# Patient Record
Sex: Male | Born: 1965 | Race: Black or African American | Hispanic: No | Smoking: Never smoker
Health system: Southern US, Community
[De-identification: ages and names within clinical notes are randomized; demographics above are authoritative.]

## PROBLEM LIST (undated history)

## (undated) DIAGNOSIS — I10 Essential (primary) hypertension: Secondary | ICD-10-CM

## (undated) DIAGNOSIS — E119 Type 2 diabetes mellitus without complications: Secondary | ICD-10-CM

## (undated) DIAGNOSIS — E785 Hyperlipidemia, unspecified: Secondary | ICD-10-CM

## (undated) HISTORY — DX: Hyperlipidemia, unspecified: E78.5

## (undated) HISTORY — DX: Essential (primary) hypertension: I10

## (undated) HISTORY — DX: Type 2 diabetes mellitus without complications: E11.9

---

## 2004-10-11 ENCOUNTER — Emergency Department (HOSPITAL_COMMUNITY): Admission: EM | Admit: 2004-10-11 | Discharge: 2004-10-11 | Payer: Self-pay | Admitting: Family Medicine

## 2005-12-30 ENCOUNTER — Emergency Department (HOSPITAL_COMMUNITY): Admission: EM | Admit: 2005-12-30 | Discharge: 2005-12-30 | Payer: Self-pay | Admitting: Family Medicine

## 2006-05-14 ENCOUNTER — Emergency Department (HOSPITAL_COMMUNITY): Admission: EM | Admit: 2006-05-14 | Discharge: 2006-05-15 | Payer: Self-pay | Admitting: Emergency Medicine

## 2006-07-02 ENCOUNTER — Emergency Department (HOSPITAL_COMMUNITY): Admission: EM | Admit: 2006-07-02 | Discharge: 2006-07-02 | Payer: Self-pay | Admitting: Family Medicine

## 2008-03-21 ENCOUNTER — Emergency Department (HOSPITAL_COMMUNITY): Admission: EM | Admit: 2008-03-21 | Discharge: 2008-03-21 | Payer: Self-pay | Admitting: Family Medicine

## 2008-09-30 IMAGING — CR DG SHOULDER 2+V*R*
3 series · 3 of 3 positions shown · non-contrast
Comparison: None.

CLINICAL DATA: Right shoulder pain following a fall.

RIGHT SHOULDER - 2+ VIEW

[view not recorded (1 of 3)]
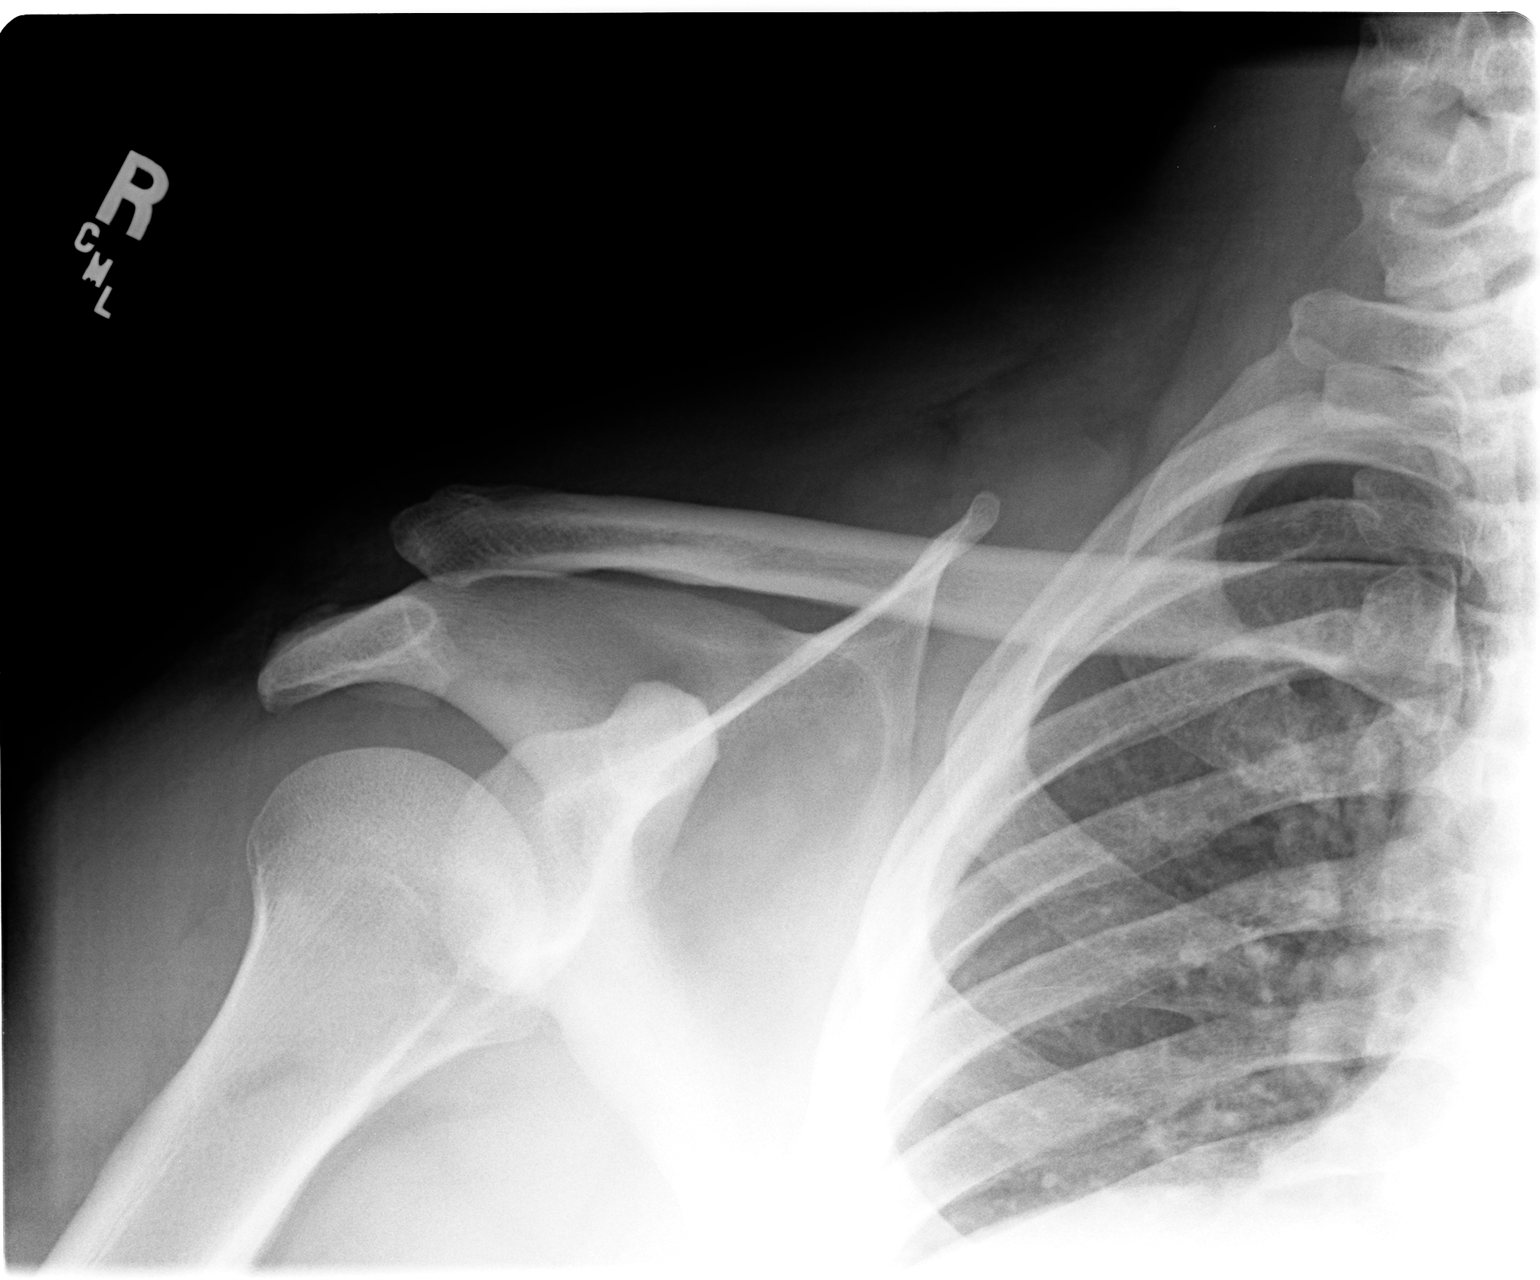

[view not recorded (2 of 3)]
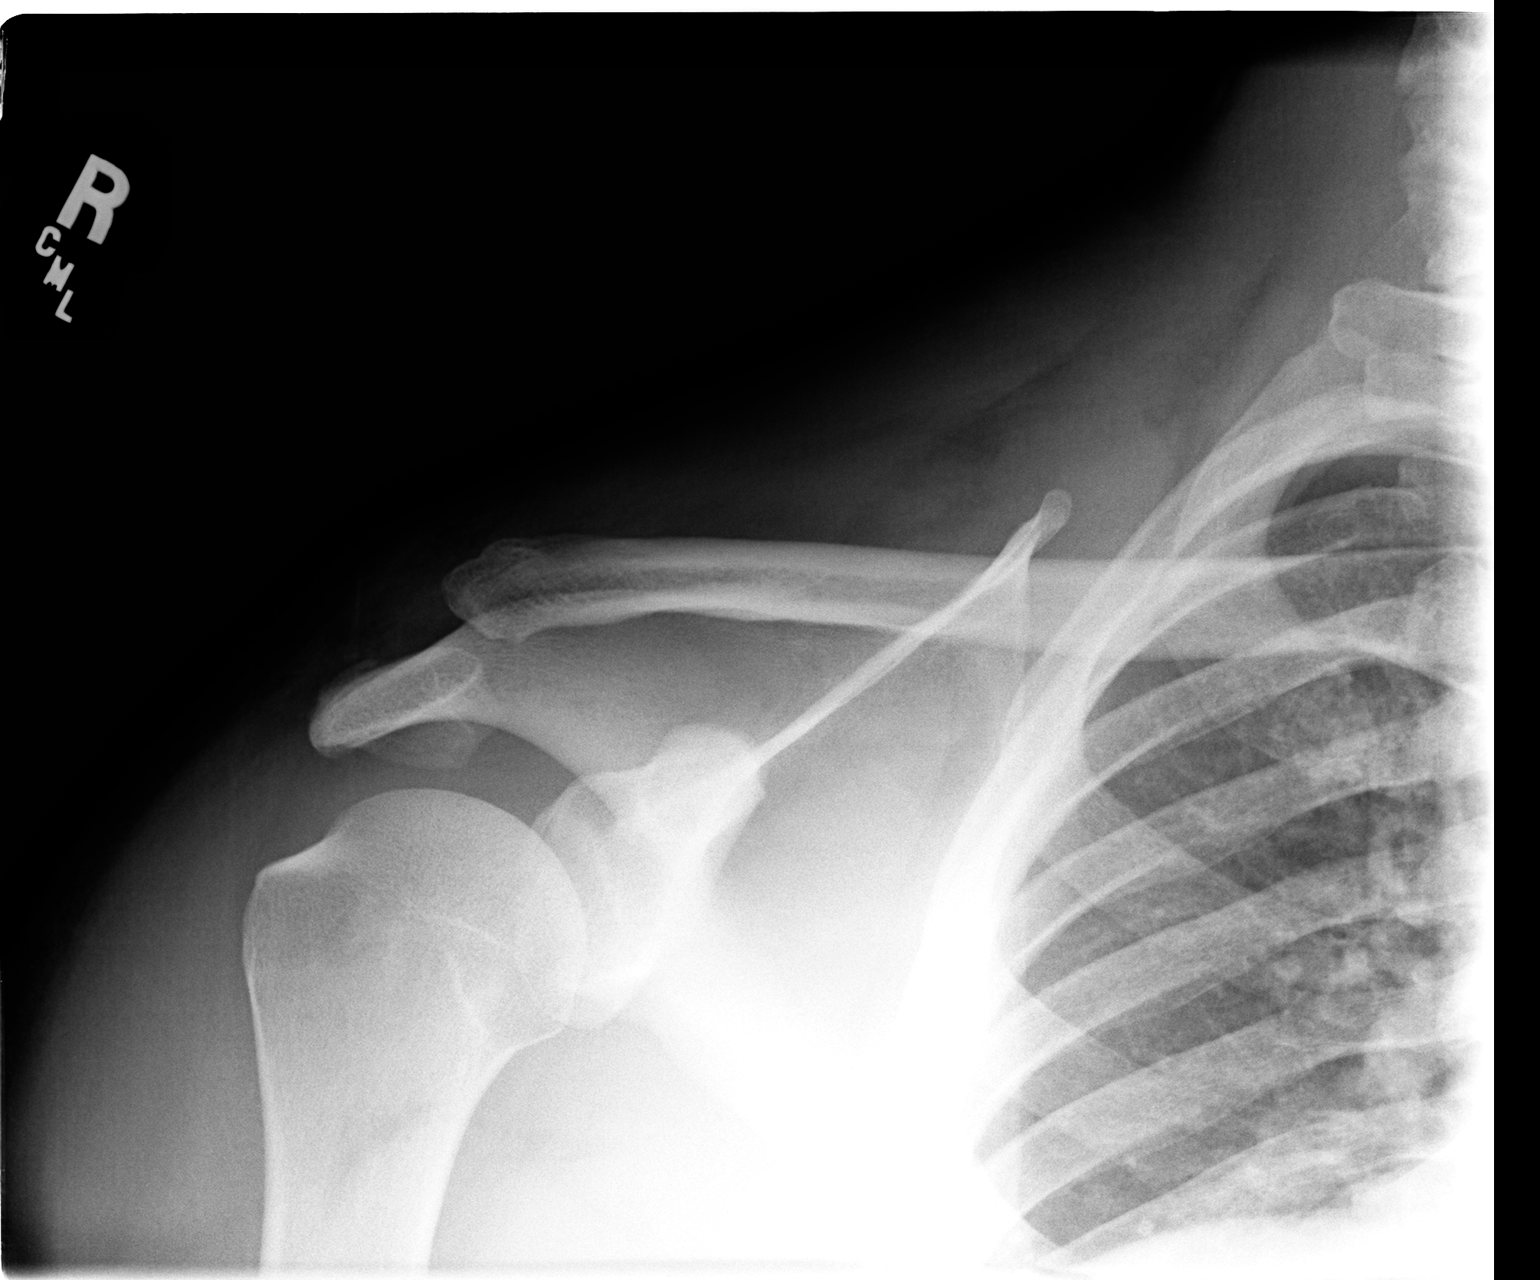

[view not recorded (3 of 3)]
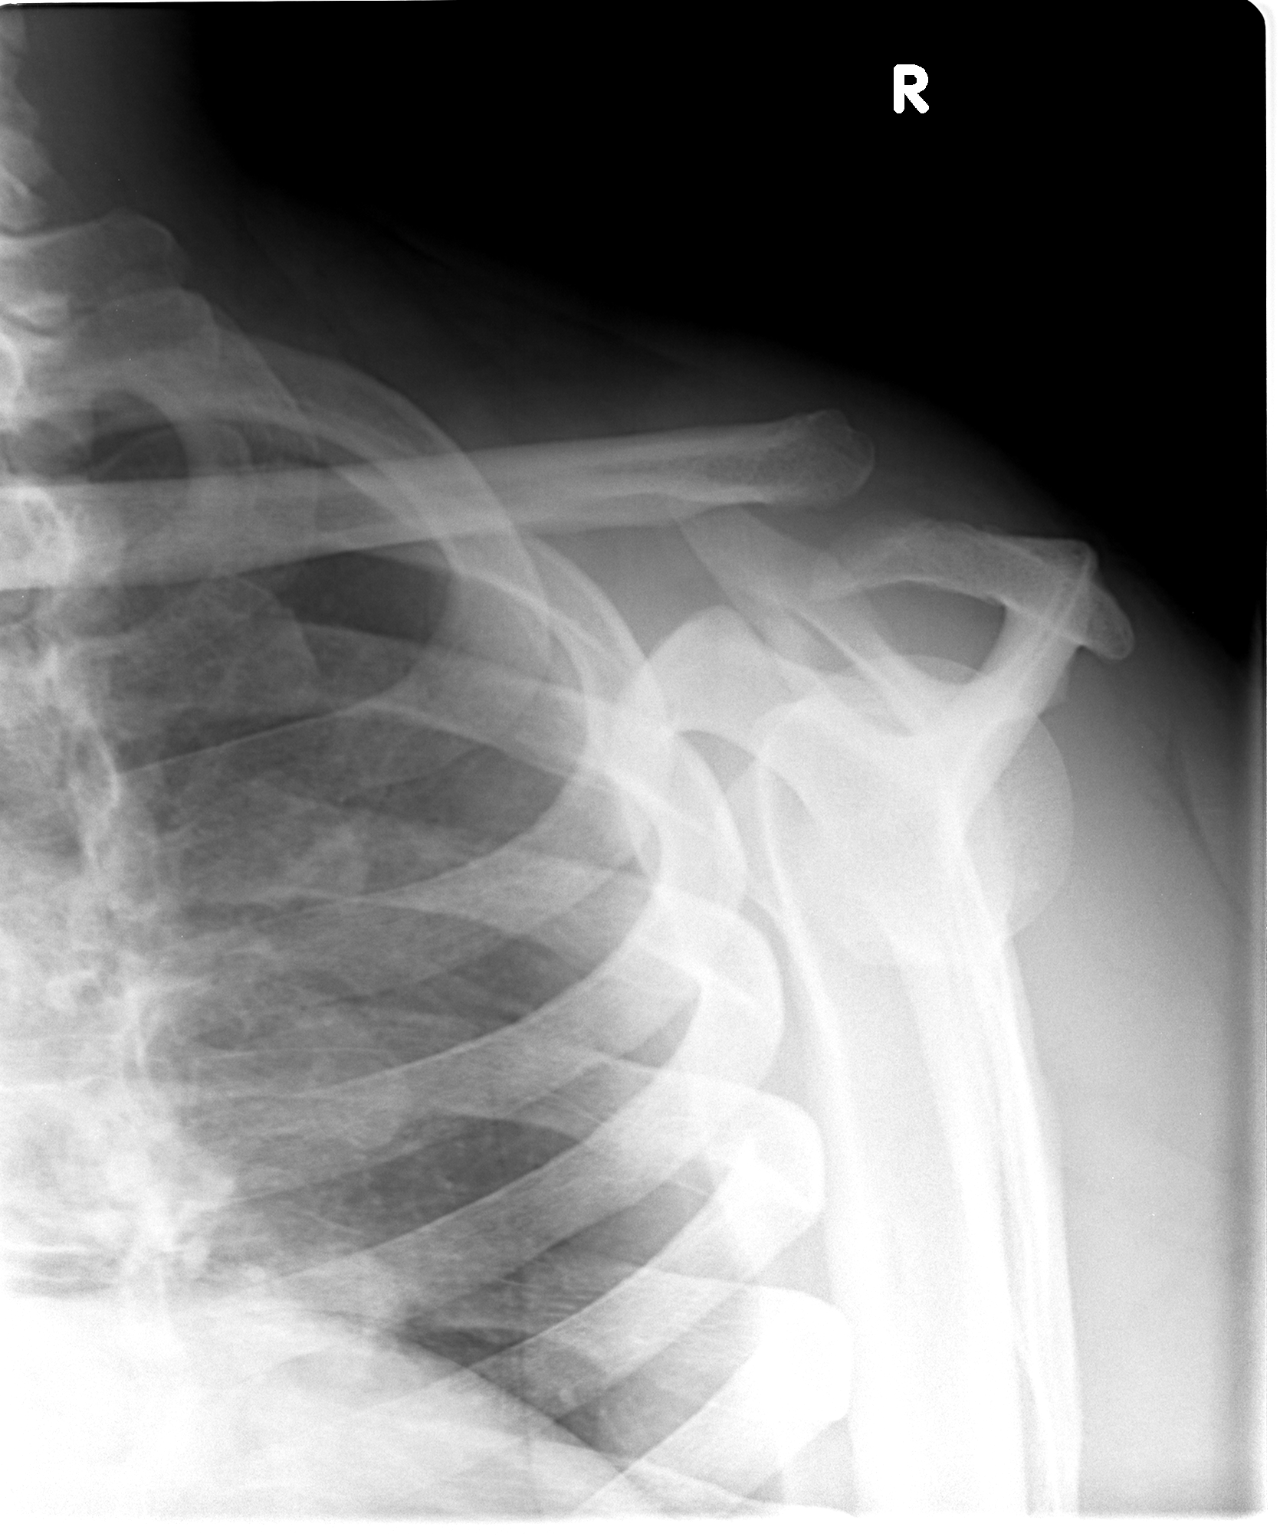

[3 of 3 positions shown; findings below may reference images not displayed]

FINDINGS: Superior subluxation of the clavicle with widening of the
coracoclavicular distance.  No fracture seen.
IMPRESSION: Grade III acromioclavicular joint separation.

## 2011-03-14 ENCOUNTER — Inpatient Hospital Stay (INDEPENDENT_AMBULATORY_CARE_PROVIDER_SITE_OTHER)
Admission: RE | Admit: 2011-03-14 | Discharge: 2011-03-14 | Disposition: A | Discharge status: Home / Self Care | Payer: Self-pay | Source: Ambulatory Visit | Attending: Family Medicine | Admitting: Family Medicine

## 2011-03-14 DIAGNOSIS — J029 Acute pharyngitis, unspecified: Secondary | ICD-10-CM

## 2011-03-14 DIAGNOSIS — H9209 Otalgia, unspecified ear: Secondary | ICD-10-CM

## 2011-03-14 DIAGNOSIS — J309 Allergic rhinitis, unspecified: Secondary | ICD-10-CM

## 2011-10-03 ENCOUNTER — Ambulatory Visit (INDEPENDENT_AMBULATORY_CARE_PROVIDER_SITE_OTHER): Payer: 59

## 2011-10-03 DIAGNOSIS — J157 Pneumonia due to Mycoplasma pneumoniae: Secondary | ICD-10-CM

## 2011-10-03 DIAGNOSIS — R509 Fever, unspecified: Secondary | ICD-10-CM

## 2013-08-17 DIAGNOSIS — M25311 Other instability, right shoulder: Secondary | ICD-10-CM | POA: Insufficient documentation

## 2013-08-17 DIAGNOSIS — M25312 Other instability, left shoulder: Secondary | ICD-10-CM | POA: Insufficient documentation

## 2013-08-17 DIAGNOSIS — S43431A Superior glenoid labrum lesion of right shoulder, initial encounter: Secondary | ICD-10-CM | POA: Insufficient documentation

## 2013-08-17 DIAGNOSIS — S43439A Superior glenoid labrum lesion of unspecified shoulder, initial encounter: Secondary | ICD-10-CM | POA: Insufficient documentation

## 2013-08-17 DIAGNOSIS — G2589 Other specified extrapyramidal and movement disorders: Secondary | ICD-10-CM | POA: Insufficient documentation

## 2013-08-17 DIAGNOSIS — S43109A Unspecified dislocation of unspecified acromioclavicular joint, initial encounter: Secondary | ICD-10-CM | POA: Insufficient documentation

## 2014-08-11 ENCOUNTER — Encounter: Payer: Self-pay | Admitting: *Deleted

## 2014-08-11 ENCOUNTER — Encounter: Payer: 59 | Attending: Family Medicine | Admitting: *Deleted

## 2014-08-11 VITALS — Ht 69.5 in | Wt 231.8 lb

## 2014-08-11 DIAGNOSIS — Z713 Dietary counseling and surveillance: Secondary | ICD-10-CM | POA: Diagnosis not present

## 2014-08-11 DIAGNOSIS — E119 Type 2 diabetes mellitus without complications: Secondary | ICD-10-CM | POA: Insufficient documentation

## 2014-08-11 NOTE — Patient Instructions (Signed)
Plan:  Aim for 3-4 Carb Choices per meal (45-60 grams) +/- 1 either way  Aim for 0-15 Carbs per snack if hungry  Include protein in moderation with your meals and snacks Consider reading food labels for Total Carbohydrate and Fat Grams of foods Consider  increasing your activity level by exercising for 30 minutes daily as tolerated Consider checking BG upon wakingeach day  Consider taking medication as directed by MD/ try not to skip meds  I will contact your primary care physician and request RX for testing supplies for Accu-Chek Nano

## 2014-08-11 NOTE — Progress Notes (Signed)
Dispensed Accu-Chek Nano Lot: [id number] Exp: 06/29/15 Glucose: 15 minutes after trail mix: 120mg /dl

## 2014-08-11 NOTE — Progress Notes (Signed)
Diabetes Self-Management Education  Visit Type:  DSME  Appt. Start Time: 1630 Appt. End Time: 1730  08/11/2014  Francisco Werner, identified by name and date of birth, is a 48 y.o. male with a diagnosis of Diabetes: Type 2.  Other people present during visit:  Spouse/SO   ASSESSMENT  Height 5' 9.5" (1.765 m), weight 231 lb 12.8 oz (105.144 kg). Body mass index is 33.75 kg/(m^2).  Initial Visit Information:  Are you currently following a meal plan?: No  Are you taking your medications as prescribed?: Yes Are you checking your feet?: No  How often do you need to have someone help you when you read instructions, pamphlets, or other written materials from your doctor or pharmacy?: 1 - Never   Psychosocial:  Patient Belief/Attitude about Diabetes: Motivated to manage diabetes Self-care barriers: None Self-management support: Doctor's office;CDE visits;Family Other persons present: Spouse/SO Patient Concerns: Monitoring;Glycemic Control;Nutrition/Meal planning Special Needs: None Preferred Learning Style: No preference indicated Learning Readiness: Change in progress  Complications:   Last HgB A1C per patient/outside source: 9.9 mg/dL How often do you check your blood sugar?: 0 times/day (not testing) Postprandial Blood glucose range (mg/dL): 65-78470-129 (tested 15 minutes following some trail mix. Result 120mg /dl) Have you had a dilated eye exam in the past 12 months?: No Have you had a dental exam in the past 12 months?: Yes  Exercise:  Exercise: Light (walking / raking leaves)  Individualized Plan for Diabetes Self-Management Training:   Learning Objective:  Patient will have a greater understanding of diabetes self-management.  Patient education plan per assessed needs and concerns is to attend individual sessions for  DSME.   Education Topics Reviewed with Patient Today:  Definition of diabetes, type 1 and 2, and the diagnosis of diabetes;Factors that contribute to  the development of diabetes;Explored patient's options for treatment of their diabetes Role of diet in the treatment of diabetes and the relationship between the three main macronutrients and blood glucose level;Food label reading, portion sizes and measuring food.;Carbohydrate counting Role of exercise on diabetes management, blood pressure control and cardiac health.;Helped patient identify appropriate exercises in relation to his/her diabetes, diabetes complications and other health issue. Reviewed patients medication for diabetes, action, purpose, timing of dose and side effects. Taught/evaluated SMBG meter.;Purpose and frequency of SMBG.;Identified appropriate SMBG and/or A1C goals.   Relationship between chronic complications and blood glucose control;Dental care;Reviewed with patient heart disease, higher risk of, and prevention;Nephropathy, what it is, prevention of, the use of ACE, ARB's and early detection of through urine microalbumia.;Lipid levels, blood glucose control and heart disease Identified and addressed patients feelings and concerns about diabetes ("I'm going to get rid of this" Diabetes. discussed progressive nature of T2DM. Level of Management vs Cure.)   Lifestyle issues that need to be addressed for better diabetes care  PATIENTS GOALS/Plan (Developed by the patient):  Nutrition: General guidelines for healthy choices and portions discussed Physical Activity:  (Patient has [employer] membership. Will return to gym as frequently as possible) Medications: take my medication as prescribed Monitoring : Other (comment) (test glucose after sleep period. 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 He usually gets about 4-5 hours of sleep) Problem Solving:  (Be aware of fat consumption as related to heart disease)   Patient Instructions  Plan:  Aim for 3-4 Carb Choices per meal (45-60 grams) +/- 1 either way  Aim for 0-15 Carbs per snack if hungry  Include protein in  moderation with your meals and snacks Consider  reading food labels for Total Carbohydrate and Fat Grams of foods Consider  increasing your activity level by exercising for 30 minutes daily as tolerated Consider checking BG upon wakingeach day  Consider taking medication as directed by MD/ try not to skip meds  I will contact your primary care physician and request RX for testing supplies for Accu-Chek Nano  Expected Outcomes:  Demonstrated interest in learning. Expect positive outcomes  Education material provided: Living Well with Diabetes, A1C conversion sheet, Meal plan card, My Plate and Snack sheet  If problems or questions, patient to contact team via:  Phone  Future DSME appointment: 4-6 wks (Patient had to go to work. We were unable to complete education. )            1630

## 2014-08-12 ENCOUNTER — Telehealth: Payer: Self-pay | Admitting: *Deleted

## 2014-09-30 ENCOUNTER — Encounter: Payer: Self-pay | Admitting: *Deleted

## 2014-09-30 ENCOUNTER — Encounter: Payer: 59 | Attending: Family Medicine | Admitting: *Deleted

## 2014-09-30 VITALS — Wt 224.0 lb

## 2014-09-30 DIAGNOSIS — E119 Type 2 diabetes mellitus without complications: Secondary | ICD-10-CM | POA: Diagnosis present

## 2014-09-30 DIAGNOSIS — Z713 Dietary counseling and surveillance: Secondary | ICD-10-CM | POA: Insufficient documentation

## 2014-09-30 NOTE — Patient Instructions (Addendum)
Snack in the evening Carb/Protein Fasting glucose in the morning before any intake Test 2hr after largest meal of the day. Continue exercise to an ultimate goal of 150 minutes per week  Keep up the good work!

## 2014-09-30 NOTE — Progress Notes (Signed)
Diabetes Self-Management Education  Appt. Start Time: 0900 Appt. End Time: 1000  09/30/2014  Mr. Alberteen SpindleWilliam Sloniker, identified by name and date of birth, is a [age] y.o. male with a diagnosis of Diabetes:  .  Other people present during visit:  Patient  Mr. Luiz BlareGraves presents for 2 months f/u visit. He has strong conviction that he will manage her T2DM, HTN, HLD through diet and exercise. He is not taking his prescribed medication. We discussed in length the relationship and risks among these three chronic conditions. He has a positive family history for all of the above. His mother recently suffered a stroke. However, he notes that she smokes and drinks. He was previously working night shift with the [employer]. He has recently been moved to day shift. I think this will be a great benefit in managing the lifestyle modifications required.  ASSESSMENT  Weight 224 lb (101.606 kg). Body mass index is 32.62 kg/(m^2).   Subsequent Visit Information:  Since your last visit, have you continued or began the use of a meal plan?: Yes How many days a week are you following a meal plan?: 7 Since your last visit, have you continued or began to exercise on a consistent basis?: Yes How many days per week are you exercising or participating in a physicial activity for more than 20 minutes?: 4 Since your last visit have you continued or begun to take your medications as prescribed?: No (Patient is determined that he will manage his chronic medical conditions through exercise and diet) Since your last visit have you had your blood pressure checked?: Yes Is your most recent blood pressure lower, unchanged, or higher since your last visit?: Lower Since your last visit have you experienced any weight changes?: Loss Weight Loss (lbs): 7.8 Since your last visit, are you checking your blood glucose at least once a day?: Yes  Psychosocial:   Patient Belief/Attitude about Diabetes: Motivated to  manage diabetes Self-management support: Doctor's office, CDE visits, Family Other persons present: Patient Patient Concerns: Healthy Lifestyle, Nutrition/Meal planning Special Needs: None Preferred Learning Style: No preference indicated Learning Readiness: Change in progress  Complications:   Last HgB A1C per patient/outside source: 9.9 mg/dL (16/10/908/1/15 ) How often do you check your blood sugar?: 1-2 times/day Fasting Blood glucose range (mg/dL): [phone] Postprandial Blood glucose range (mg/dL): [phone] Number of hypoglycemic episodes per month: 0 Number of hyperglycemic episodes per week: 0  Diet Intake:  Breakfast: Rolled/steel oats, greens, meal, spices Lunch: skinless baked chicken, vegetables Dinner: 2 pieces of pizza with meat and cheese Snack (evening): almonds  Exercise:  Exercise: Strenuous (running) (Goes to the gym and works out approx 3 times per week) Strenous Exercise amount of time (min / week): 115   Individualized Plan for Diabetes Self-Management Training:   Learning Objective:  Patient will have a greater understanding of diabetes self-management. Patient education plan per assessed needs and concerns is to attend individual sessions for    Education Topics Reviewed with Patient Today:  Factors that contribute to the development of diabetes, Explored patient's options for treatment of their diabetes Role of diet in the treatment of diabetes and the relationship between the three main macronutrients and blood glucose level, Reviewed blood glucose goals for pre and post meals and how to evaluate the patients' food intake on their blood glucose level., Effects of alcohol on blood glucose and safety factors with consumption of alcohol. Role of exercise on diabetes management, blood pressure control and cardiac health.  Identified appropriate SMBG and/or A1C goals. (Next A1c shceduled for January. Patient determined it will be about 6%.)  Relationship between  chronic complications and blood glucose control, Lipid levels, blood glucose control and heart disease, Identified and discussed with patient  current chronic complications Role of stress on diabetes    PATIENTS GOALS/Plan (Developed by the patient):  Nutrition: General guidelines for healthy choices and portions discussed Physical Activity: Exercise 3-5 times per week Medications: Other (comment) (Patient chooses not to take medications) Monitoring : test my blood glucose as discussed (note x per day with comment) (FBS and 2hpp largest meal)  Patient Self Evaluation of Goals - Patient rates self as meeting previously set goals:   Nutrition: >75% Physical Activity: >75% Medications: < 25% Monitoring: 50 - 75 % Reducing Risk: 50 - 75 % Health Coping: 50 - 75 %  Patient Instructions  Plan: Snack in the evening Carb/Protein Fasting glucose )<100) in the morning before any intake Test 2hr after largest meal of the day. (<120) Continue exercise to an ultimate goal of 150 minutes per week  Keep up the good work!        Personal A1c goal 6%  Expected Outcomes:  Demonstrated interest in learning. Expect positive outcomes  If problems or questions, patient to contact team via:  Phone  Future DSME appointment: - PRN

## 2014-10-24 ENCOUNTER — Emergency Department (HOSPITAL_COMMUNITY)
Admission: EM | Admit: 2014-10-24 | Discharge: 2014-10-24 | Disposition: A | Discharge status: Home / Self Care | Payer: 59 | Source: Home / Self Care | Attending: Family Medicine | Admitting: Family Medicine

## 2014-10-24 ENCOUNTER — Encounter (HOSPITAL_COMMUNITY): Payer: Self-pay | Admitting: *Deleted

## 2014-10-24 DIAGNOSIS — S161XXA Strain of muscle, fascia and tendon at neck level, initial encounter: Secondary | ICD-10-CM

## 2014-10-24 MED ORDER — CYCLOBENZAPRINE HCL 5 MG PO TABS: ORAL_TABLET | ORAL | Status: DC
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
## 2014-10-24 NOTE — Discharge Instructions (Signed)
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
## 2015-06-27 NOTE — Telephone Encounter (Signed)
08/12/2014 03:15 PM Phone (Outgoing) Francisco Werner, Francisco Werner (Self) [phone] (H)  Completed - Obtained pharmacy location to send testing supply order [facility] [address] [place]   By Rosendo Gros, RN  08/12/2014 03:30 PM Phone (Outgoing) Tammy (PCP) [phone]  Completed - Advised disp of Accu-Chek Nano, request order to [facility] [address] for test strips and lancets for Accu-Chek Fast Clix for testing 1X per day.   By Rosendo Gros, RN

## 2015-09-19 ENCOUNTER — Ambulatory Visit: Payer: Self-pay | Admitting: Sports Medicine

## 2016-08-19 ENCOUNTER — Ambulatory Visit (HOSPITAL_COMMUNITY): Admission: EM | Admit: 2016-08-19 | Discharge: 2016-08-19 | Discharge status: Absent without leave | Payer: Self-pay

## 2016-08-19 NOTE — Progress Notes (Signed)
 Subjective:   Patient ID: Francisco Werner is a 50 y.o. male.    Flu Offering: Have you had your flu shot this year?: No My recommendation would be that we go ahead and immunize you today as part of your healthcare plan: Patient agrees      History  Smoking Status  . Never Smoker  Smokeless Tobacco  . Not on file   Past Medical History:  Diagnosis Date  . GERD (gastroesophageal reflux disease)    No past surgical history on file. No family history on file.   Objective:  Physical Exam  Constitutional: He is oriented to person, place, and time. He appears well-developed and well-nourished. He is active.  Non-toxic appearance. He does not have a sickly appearance.  HENT:  Head: Normocephalic and atraumatic.  Right Ear: Hearing, tympanic membrane, external ear and ear canal normal.  Left Ear: Hearing, tympanic membrane, external ear and ear canal normal.  Nose: Mucosal edema and rhinorrhea present.  Mouth/Throat: Uvula is midline and oropharynx is clear and moist.  Nose- bilateral erythema Posterior oropharynx was hard to visualize despite patient attempted cooperation- patient has a large tongue that with despite tongue depressor use was hard to discriminate size of tonsils- when asked to say ahhh in an attempt to heighten oropharynx small circular mass about 1/2 the size of a dime was visualized- patient did complaint of feeling as mass in the back of throat that he was having trouble coughing up that he as well as coughing up blood.  Eyes: Conjunctivae and EOM are normal. Pupils are equal, round, and reactive to light.  Neck: Normal range of motion. Neck supple. Normal carotid pulses, no hepatojugular reflux and no JVD present. Carotid bruit is not present.  Cardiovascular: Normal rate.   Elevated blood pressure- known high blood pressure and non compliant with medication  Pulmonary/Chest: Effort normal. He has rhonchi in the right upper field and the right middle field. He  has rales in the right upper field and the right middle field.  Abdominal: Soft. Normal appearance and bowel sounds are normal.  Musculoskeletal: Normal range of motion.  Lymphadenopathy:    He has no cervical adenopathy.  Neurological: He is alert and oriented to person, place, and time.  Skin: Skin is warm and dry.  Psychiatric: He has a normal mood and affect. His behavior is normal. Judgment and thought content normal.  Vitals reviewed.       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 Patient reported coughing up blood and has moderate cough on exam- but denies knowing when the last time his TB tine test was completed. Recommendations include possible ENT eval for feeling of throat mass, updated TB skin test, and f/u for hypertension.    Vaccine Information Statement reviewed with patient. Vaccine administered in accordance with [facility] guidelines.

## 2016-08-19 NOTE — Progress Notes (Signed)
  Subjective:   Patient ID: Francisco Werner is a 50 y.o. male here for a Sore Throat visit.  Vaccine consent has been completed and reviewed.          Lifestyle:   Francisco Werner  reports that he has never smoked. He does not have any smokeless tobacco history on file.     Objective:      Assessment/Plan:     Vaccine administered in accordance with [facility] guidelines.   Patient advised to contact [facility] if adverse event occurs.

## 2017-01-17 DIAGNOSIS — E119 Type 2 diabetes mellitus without complications: Secondary | ICD-10-CM | POA: Diagnosis not present

## 2017-07-27 DIAGNOSIS — E119 Type 2 diabetes mellitus without complications: Secondary | ICD-10-CM | POA: Diagnosis not present

## 2017-07-27 DIAGNOSIS — H40033 Anatomical narrow angle, bilateral: Secondary | ICD-10-CM | POA: Diagnosis not present

## 2017-10-01 DIAGNOSIS — Z125 Encounter for screening for malignant neoplasm of prostate: Secondary | ICD-10-CM | POA: Diagnosis not present

## 2017-10-01 DIAGNOSIS — Z0000 Encounter for general adult medical examination without abnormal findings: Secondary | ICD-10-CM | POA: Diagnosis not present

## 2017-10-01 DIAGNOSIS — E119 Type 2 diabetes mellitus without complications: Secondary | ICD-10-CM | POA: Diagnosis not present

## 2017-10-01 DIAGNOSIS — E291 Testicular hypofunction: Secondary | ICD-10-CM | POA: Diagnosis not present

## 2017-10-03 DIAGNOSIS — M25569 Pain in unspecified knee: Secondary | ICD-10-CM | POA: Diagnosis not present

## 2017-10-03 DIAGNOSIS — Z23 Encounter for immunization: Secondary | ICD-10-CM | POA: Diagnosis not present

## 2017-10-03 DIAGNOSIS — E291 Testicular hypofunction: Secondary | ICD-10-CM | POA: Diagnosis not present

## 2017-10-03 DIAGNOSIS — E119 Type 2 diabetes mellitus without complications: Secondary | ICD-10-CM | POA: Diagnosis not present

## 2017-11-01 DIAGNOSIS — M25562 Pain in left knee: Secondary | ICD-10-CM | POA: Diagnosis not present

## 2017-11-01 DIAGNOSIS — M25561 Pain in right knee: Secondary | ICD-10-CM | POA: Diagnosis not present

## 2017-12-03 DIAGNOSIS — M2241 Chondromalacia patellae, right knee: Secondary | ICD-10-CM | POA: Diagnosis not present

## 2017-12-03 DIAGNOSIS — M2242 Chondromalacia patellae, left knee: Secondary | ICD-10-CM | POA: Diagnosis not present

## 2017-12-05 DIAGNOSIS — Z1211 Encounter for screening for malignant neoplasm of colon: Secondary | ICD-10-CM | POA: Diagnosis not present

## 2018-03-07 ENCOUNTER — Ambulatory Visit: Payer: 59 | Admitting: Podiatry

## 2018-03-07 ENCOUNTER — Other Ambulatory Visit: Payer: Self-pay

## 2018-03-07 ENCOUNTER — Encounter: Payer: Self-pay | Admitting: Podiatry

## 2018-03-07 ENCOUNTER — Ambulatory Visit (INDEPENDENT_AMBULATORY_CARE_PROVIDER_SITE_OTHER): Payer: 59

## 2018-03-07 VITALS — Resp 16

## 2018-03-07 DIAGNOSIS — M2012 Hallux valgus (acquired), left foot: Secondary | ICD-10-CM | POA: Diagnosis not present

## 2018-03-07 DIAGNOSIS — M2011 Hallux valgus (acquired), right foot: Secondary | ICD-10-CM | POA: Diagnosis not present

## 2018-03-07 DIAGNOSIS — M21619 Bunion of unspecified foot: Secondary | ICD-10-CM

## 2018-03-07 NOTE — Progress Notes (Signed)
   Subjective:    Patient ID: Francisco Werner, male    DOB: 1966-02-17, 52 y.o.   MRN: [id number]  HPI    Review of Systems  All other systems reviewed and are negative.      Objective:   Physical Exam        Assessment & Plan:

## 2018-03-07 NOTE — Patient Instructions (Signed)

## 2018-03-10 NOTE — Progress Notes (Signed)
Subjective:   Patient ID: Francisco Werner, male   DOB: 52 y.o.   MRN: [id number]   HPI Patient presents stating that he is having more problems with his bunion on his right foot and his big toe pressing against the second toe he is tried wider shoes and is tried other modalities.  He has a several year history of diabetes and admits lately has not been taking his good care of it and his A1c taken approximately 6 months ago was 11.0.  He is due to see his family physician and states he is doing a better job of taking care of it now.  Patient does not smoke and likes to be active and has no claudication-like symptomatology   Review of Systems  All other systems reviewed and are negative.       Objective:  Physical Exam  Constitutional: He appears well-developed and well-nourished.  Cardiovascular: Intact distal pulses.  Pulmonary/Chest: Effort normal.  Musculoskeletal: Normal range of motion.  Neurological: He is alert.  Skin: Skin is warm.  Nursing note and vitals reviewed.   Neurovascular status intact muscle strength is found to be adequate with patient found to have hyperostosis with medial pain around the first metatarsal head right over left with deviation of the hallux against second toe with pain associated with this.  Patient has good digital perfusion is well oriented x3 and had adequate hair growth noted bilateral.  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  He is going to see his doctor and discuss this but I do think he is an acceptable risk for surgery but I would like the impression of his internal medicine physician who has taken care of him for a long time.  He does see Dr. Duane Lope and I advised him to discuss this with him and he is going to meet with him  prior to [place] discussing surgery.  I do think you do not undergo distal osteotomy along with Akin osteotomy right for correction of his deformity and tentatively he is scheduled for June but we will wait for his visit to his family physician  X-rays indicate elevation of the intermetatarsal angle with significant deviation of the hallux and deformity of the hallux itself right foot

## 2018-03-18 ENCOUNTER — Encounter: Payer: Self-pay | Admitting: *Deleted

## 2018-03-18 ENCOUNTER — Telehealth: Payer: Self-pay | Admitting: *Deleted

## 2018-03-18 NOTE — Telephone Encounter (Signed)
I'm calling from Dr. Beverlee Nims office.  I am calling to see if you have gotten medical clearance for your surgery.  I have not, I have been procrastinating and it has slipped up on me.  I guess I better get on that."  I faxed Dr. Tenny Craw a medical clearance request but you may want to follow-up on it.  "Okay, I certainly will.  I will do that this afternoon."

## 2018-03-19 DIAGNOSIS — E1165 Type 2 diabetes mellitus with hyperglycemia: Secondary | ICD-10-CM | POA: Diagnosis not present

## 2018-03-19 DIAGNOSIS — J069 Acute upper respiratory infection, unspecified: Secondary | ICD-10-CM | POA: Diagnosis not present

## 2018-03-20 ENCOUNTER — Telehealth: Payer: Self-pay | Admitting: Podiatry

## 2018-03-20 NOTE — Telephone Encounter (Signed)
This is April calling from Dr. Vianne Bulls' office. I'm calling to cancel surgery for Francisco Werner, Date of Birth 17-Jul-1966. I'm cancelling surgery because is because of uncontrolled diabetes and we are starting him on insulin. He has an appointment with [place] tomorrow. If you have any questions, you can give me a call back at [phone]. Thank you.

## 2018-03-21 ENCOUNTER — Ambulatory Visit: Payer: 59 | Admitting: Podiatry

## 2018-04-02 ENCOUNTER — Other Ambulatory Visit: Payer: 59

## 2018-04-14 DIAGNOSIS — E119 Type 2 diabetes mellitus without complications: Secondary | ICD-10-CM | POA: Diagnosis not present

## 2018-05-09 DIAGNOSIS — Z5181 Encounter for therapeutic drug level monitoring: Secondary | ICD-10-CM | POA: Diagnosis not present

## 2018-05-09 DIAGNOSIS — E1165 Type 2 diabetes mellitus with hyperglycemia: Secondary | ICD-10-CM | POA: Diagnosis not present

## 2018-05-09 DIAGNOSIS — Z833 Family history of diabetes mellitus: Secondary | ICD-10-CM | POA: Diagnosis not present

## 2018-07-15 DIAGNOSIS — I10 Essential (primary) hypertension: Secondary | ICD-10-CM | POA: Diagnosis not present

## 2018-08-06 DIAGNOSIS — Z833 Family history of diabetes mellitus: Secondary | ICD-10-CM | POA: Diagnosis not present

## 2018-08-06 DIAGNOSIS — Z23 Encounter for immunization: Secondary | ICD-10-CM | POA: Diagnosis not present

## 2018-08-06 DIAGNOSIS — Z5181 Encounter for therapeutic drug level monitoring: Secondary | ICD-10-CM | POA: Diagnosis not present

## 2018-08-06 DIAGNOSIS — E1165 Type 2 diabetes mellitus with hyperglycemia: Secondary | ICD-10-CM | POA: Diagnosis not present

## 2018-08-08 DIAGNOSIS — E1165 Type 2 diabetes mellitus with hyperglycemia: Secondary | ICD-10-CM | POA: Diagnosis not present

## 2018-08-15 DIAGNOSIS — M79622 Pain in left upper arm: Secondary | ICD-10-CM | POA: Diagnosis not present

## 2020-02-04 ENCOUNTER — Ambulatory Visit: Payer: Self-pay | Attending: Internal Medicine

## 2020-02-04 DIAGNOSIS — Z23 Encounter for immunization: Secondary | ICD-10-CM

## 2020-02-04 NOTE — Progress Notes (Signed)
   Covid-19 Vaccination Clinic  Name:  Francisco Werner    MRN: [id number] DOB: 07/21/1966  02/04/2020  Mr. Francisco Werner was observed post Covid-19 immunization for 30 minutes based on pre-vaccination screening without incident. He was provided with Vaccine Information Sheet and instruction to access the V-Safe system.   Mr. Francisco Werner was instructed to call 911 with any severe reactions post vaccine: Marland Kitchen Difficulty breathing  . Swelling of face and throat  . A fast heartbeat  . A bad rash all over body  . Dizziness and weakness   Immunizations Administered    Name Date Dose VIS Date Route   Pfizer COVID-19 Vaccine 02/04/2020  9:59 AM 0.3 mL 10/09/2019 Intramuscular   Manufacturer: [employer], [employer]   Lot: [id number]   NDC: [address]-6

## 2020-02-25 ENCOUNTER — Ambulatory Visit: Payer: Self-pay | Attending: Internal Medicine

## 2020-02-25 DIAGNOSIS — Z23 Encounter for immunization: Secondary | ICD-10-CM

## 2020-02-25 NOTE — Progress Notes (Signed)
   Covid-19 Vaccination Clinic  Name:  BRETON BERNS    MRN: [id number] DOB: [birth year]  02/25/2020  Mr. Pieroni was observed post Covid-19 immunization for 15 minutes without incident. He was provided with Vaccine Information Sheet and instruction to access the V-Safe system.   Mr. Chiappetta was instructed to call 911 with any severe reactions post vaccine: Marland Kitchen Difficulty breathing  . Swelling of face and throat  . A fast heartbeat  . A bad rash all over body  . Dizziness and weakness   Immunizations Administered    Name Date Dose VIS Date Route   Pfizer COVID-19 Vaccine 02/25/2020 12:57 PM 0.3 mL 12/23/2018 Intramuscular   Manufacturer: [employer], [employer]   Lot: [id number]   NDC: [address]-5

## 2020-03-01 ENCOUNTER — Ambulatory Visit: Payer: Self-pay

## 2021-04-30 NOTE — Progress Notes (Signed)
 Patient was reached and results were delivered.

## 2022-04-02 NOTE — Progress Notes (Signed)
 HISTORY OF PRESENT ILLNESS 56 year old male presents for evaluation of bilateral knee pain.  He has had pain in both knees for years.  His pain is worse on the right than it is on the left.  He thinks he may have had an injury to his right knee in the past doing some, lateral movements.  He is also had an episode of catching or locking in his right knee with lateral movement.  He says the pain in his knees makes it difficult for him to exercise and be active.  PHYSICAL EXAMINATION GENERAL:  No acute distress PSYCHIATRIC: Normal mood and affect HEENT: Normocephalic, atraumatic.  Extraocular muscles intact. No drainage from ears or nose. LUNGS: Normal respiratory effort. CV: Peripheral pulses intact.  MSK EXAM Examination of the right knee reveals no swelling or deformity.  He has good range of motion of the knee.  His knee is stable to varus and valgus stress.  There is tenderness over the medial joint line.  Examination of the left knee reveals no swelling or deformity.  He has good range of motion of the knee.  His knee is stable to varus and valgus stress.  He does have tenderness over the medial joint line.  IMAGING / DIAGNOSTIC TEST RESULTS XR IMAGING RESULTXR knee 3 views bilateral      DOS: 04/02/2022 Three standing views of both knees are obtained in the office today.  There is some mild medial joint space narrowing in both knees.    IMPRESSION Acute pain of both knees   PLAN X-ray findings were discussed with the patient today.  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  We are going to get an MRI of his right knee due to the chronic nature of his pain as well as some of the mechanical symptoms.  I am also going to give him an injection today.  He will follow-up after the MRI.  *L Inj/Asp: R knee on 04/02/2022 8:10 AM Indications: pain Details: 22 G needle, anteromedial approach Medications: 80 mg triamcinolone  acetonide 40 MG/ML; 1 mL lidocaine  1 %    Orders Placed This Encounter  . XR knee 3 views bilateral  . MRI knee right wo contrast     Disclaimer This note has been created with voice recognition software.  While this note has been edited for accuracy, the software periodically misinterprets speech resulting in errors that might not have been caught in editing.  In the event you find an unusual error in this record, please notify us  at [phone] to resolve the same.

## 2022-09-24 ENCOUNTER — Other Ambulatory Visit (HOSPITAL_COMMUNITY): Payer: Self-pay | Admitting: Family Medicine

## 2022-09-24 DIAGNOSIS — Z0000 Encounter for general adult medical examination without abnormal findings: Secondary | ICD-10-CM

## 2022-10-10 ENCOUNTER — Ambulatory Visit (HOSPITAL_COMMUNITY)
Admission: RE | Admit: 2022-10-10 | Discharge: 2022-10-10 | Disposition: A | Discharge status: Home / Self Care | Payer: Self-pay | Source: Ambulatory Visit | Attending: Family Medicine | Admitting: Family Medicine

## 2022-10-10 DIAGNOSIS — Z0000 Encounter for general adult medical examination without abnormal findings: Secondary | ICD-10-CM | POA: Insufficient documentation

## 2022-12-25 ENCOUNTER — Ambulatory Visit
Admission: EM | Admit: 2022-12-25 | Discharge: 2022-12-25 | Disposition: A | Discharge status: Home / Self Care | Payer: 59 | Attending: Physician Assistant | Admitting: Physician Assistant

## 2022-12-25 DIAGNOSIS — R0789 Other chest pain: Secondary | ICD-10-CM

## 2022-12-25 DIAGNOSIS — S29011A Strain of muscle and tendon of front wall of thorax, initial encounter: Secondary | ICD-10-CM

## 2022-12-25 MED ORDER — TIZANIDINE HCL 4 MG PO TABS: 4.0000 mg | ORAL_TABLET | Freq: Four times a day (QID) | ORAL | 0 refills | Status: DC | PRN

## 2022-12-25 NOTE — ED Triage Notes (Signed)
Pt presents with generalized chest pain that he believes to be a pulled muscle from working out.

## 2022-12-25 NOTE — ED Provider Notes (Signed)
[facility]    CSN: [id number] Arrival date & time: 12/25/22  1338      History   Chief Complaint Chief Complaint  Patient presents with   Muscle Pain    HPI Francisco Werner is a 57 y.o. male.   57 year old male presents with a left anterior muscle pain.  Patient indicates last night he was bench pressing weights when he felt the left pec muscle tear.  Patient indicates that immediately he had swelling of the left pectoralis muscle.  He indicates last night he had extreme pain and discomfort.  He indicates he did ice the area down and take Tylenol and a muscle relaxer that his wife gave him.  He indicates this gave him relief from the discomfort.  He indicates that today the area is still swollen and is enlarged twice the size of the right pec muscle.  He indicates he is not having any difficulty breathing, shortness of breath.  He indicates he is not having any left arm numbness, tingling, or weakness.  He indicates he is able to move the left arm and shoulder normally without restriction.  Patient indicates he is just concerned with the amount of swelling on the left side versus the right.   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 No pertinent surgical history.     Home Medications    Prior to Admission medications   Medication Sig Start Date End Date Taking? Authorizing Provider  tiZANidine (ZANAFLEX) 4 MG tablet Take 1 tablet (4 mg total) by mouth every 6 (six) hours as needed for muscle spasms. 12/25/22  Yes Nyoka Lint, PA-C  GAVILYTE-G 236 g solution See admin instructions. 12/04/17   [provider]  losartan (COZAAR) 50 MG tablet Take 50 mg by mouth daily.    [provider]  metFORMIN (GLUCOPHAGE) 500 MG tablet Take 500 mg by mouth 2 (two) times daily with a meal.    [provider]  Multiple Vitamin (MULTIVITAMIN) tablet Take 1 tablet by mouth 2 (two) times daily.    [provider]  pravastatin (PRAVACHOL) 10 MG tablet Take 10 mg by mouth daily.    [provider]    Family History Family History  Problem Relation Age of Onset   Diabetes Mother    Cancer Mother        breast   Cancer Father        appendix   Diabetes Father    Hypertension Father     Social History Social History   Tobacco Use   Smoking status: Never   Smokeless tobacco: Never  Substance Use Topics   Alcohol use: Yes    Comment: occasional beer   Drug use: No     Allergies   Shellfish-derived products   Review of Systems Review of Systems  Musculoskeletal:  Positive for joint swelling (left pectoralis swelling).     Physical Exam Triage Vital Signs ED Triage Vitals  Enc Vitals Group  BP 12/25/22 1401 (!) 160/107     Pulse Rate 12/25/22 1401 71     Resp 12/25/22 1401 18     Temp 12/25/22 1401 98.4 F (36.9 C)     Temp Source 12/25/22 1401 Oral     SpO2 12/25/22 1401 97 %     Weight --      Height --      Head Circumference --      Peak Flow --      Pain Score 12/25/22 1400 5     Pain Loc --      Pain Edu? --      Excl. in [place]? --    No data found.  Updated Vital Signs BP (!) 160/107 (BP Location: Right Arm)   Pulse 71   Temp 98.4 F (36.9 C) (Oral)   Resp 18   SpO2 97%   Visual Acuity Right Eye Distance:   Left Eye Distance:   Bilateral Distance:    Right Eye Near:   Left Eye Near:    Bilateral Near:     Physical Exam Constitutional:      Appearance: Normal appearance.  Cardiovascular:     Rate and Rhythm: Normal rate and  regular rhythm.     Heart sounds: Normal heart sounds.  Chest:       Comments: Chest wall: The left pectoralis muscle has 2+ swelling with mild bruising in the axilla area.  Mild tenderness on palpation.  Left shoulder full range of motion is intact, strength is intact, stability is normal.  There is no crepitus on palpation of the left pectoralis muscle and left chest wall area. Neurological:     Mental Status: He is alert.      UC Treatments / Results  Labs (all labs ordered are listed, but only abnormal results are displayed) Labs Reviewed - No data to display  EKG   Radiology No results found.  Procedures Procedures (including critical care time)  Medications Ordered in UC Medications - No data to display  Initial Impression / Assessment and Plan / UC Course  I have reviewed the triage vital signs and the nursing notes.  Pertinent labs & imaging results that were available during my care of the patient were reviewed by me and considered in my medical decision making (see chart for details).    Plan: The diagnosis will be treated with the following: 1.  Left pectoralis muscle strain: A.  Advised take extra-strength Tylenol on a regular basis to decrease pain. B.  Advised take Zanaflex 4 mg every 8 hours to help reduce muscle spasm. C.  Advised to use ice therapy, 10 minutes on 20 minutes off, 3-4 times a day for the next several days to decrease pain and swelling. 2.  Left-sided chest wall pain: A.  Advised take Tylenol extra strength on a regular basis to decrease pain. 3.  Patient advised to see either [facility] or [facility] within the next 48 hours for evaluation and modify treatment if needed. 4.  Advised to return to urgent care as needed. Final Clinical Impressions(s) / UC Diagnoses   Final diagnoses:  Left-sided chest wall pain  Strain of left pectoralis muscle, initial encounter     Discharge Instructions      Advised to continue  ice therapy, 10 minutes on 20 minutes off, 3-4 times throughout the day to help reduce swelling and discomfort. Advised take Tylenol extra strength on a regular basis to help decrease pain. Advised  take Zanaflex 4 mg every 6 hours on a regular basis to help reduce muscle spasm and irritability.  Advised to either see [facility] for evaluation or [facility].       ED Prescriptions     Medication Sig Dispense Auth. Provider   tiZANidine (ZANAFLEX) 4 MG tablet Take 1 tablet (4 mg total) by mouth every 6 (six) hours as needed for muscle spasms. 30 tablet Nyoka Lint, PA-C      PDMP not reviewed this encounter.   Nyoka Lint, PA-C 12/25/22 1438

## 2022-12-25 NOTE — Discharge Instructions (Addendum)
Advised to continue ice therapy, 10 minutes on 20 minutes off, 3-4 times throughout the day to help reduce swelling and discomfort. Advised take Tylenol extra strength on a regular basis to help decrease pain. Advised take Zanaflex 4 mg every 6 hours on a regular basis to help reduce muscle spasm and irritability.  Advised to either see [facility] for evaluation or [facility].

## 2024-06-27 ENCOUNTER — Emergency Department (HOSPITAL_COMMUNITY)

## 2024-06-27 ENCOUNTER — Encounter (HOSPITAL_COMMUNITY): Payer: Self-pay

## 2024-06-27 ENCOUNTER — Other Ambulatory Visit: Payer: Self-pay

## 2024-06-27 ENCOUNTER — Inpatient Hospital Stay (HOSPITAL_COMMUNITY)
Admission: EM | Admit: 2024-06-27 | Discharge: 2024-06-30 | DRG: 100 | Disposition: A | Discharge status: Home / Self Care | Attending: Internal Medicine | Admitting: Internal Medicine

## 2024-06-27 DIAGNOSIS — E1100 Type 2 diabetes mellitus with hyperosmolarity without nonketotic hyperglycemic-hyperosmolar coma (NKHHC): Secondary | ICD-10-CM | POA: Insufficient documentation

## 2024-06-27 DIAGNOSIS — Y92009 Unspecified place in unspecified non-institutional (private) residence as the place of occurrence of the external cause: Secondary | ICD-10-CM | POA: Diagnosis not present

## 2024-06-27 DIAGNOSIS — E1110 Type 2 diabetes mellitus with ketoacidosis without coma: Secondary | ICD-10-CM | POA: Insufficient documentation

## 2024-06-27 DIAGNOSIS — E785 Hyperlipidemia, unspecified: Secondary | ICD-10-CM | POA: Diagnosis present

## 2024-06-27 DIAGNOSIS — Z833 Family history of diabetes mellitus: Secondary | ICD-10-CM | POA: Diagnosis not present

## 2024-06-27 DIAGNOSIS — E1010 Type 1 diabetes mellitus with ketoacidosis without coma: Secondary | ICD-10-CM | POA: Diagnosis present

## 2024-06-27 DIAGNOSIS — Z886 Allergy status to analgesic agent status: Secondary | ICD-10-CM | POA: Diagnosis not present

## 2024-06-27 DIAGNOSIS — Z794 Long term (current) use of insulin: Secondary | ICD-10-CM

## 2024-06-27 DIAGNOSIS — Z7982 Long term (current) use of aspirin: Secondary | ICD-10-CM

## 2024-06-27 DIAGNOSIS — N182 Chronic kidney disease, stage 2 (mild): Secondary | ICD-10-CM | POA: Diagnosis present

## 2024-06-27 DIAGNOSIS — I672 Cerebral atherosclerosis: Secondary | ICD-10-CM | POA: Diagnosis present

## 2024-06-27 DIAGNOSIS — N179 Acute kidney failure, unspecified: Secondary | ICD-10-CM | POA: Diagnosis present

## 2024-06-27 DIAGNOSIS — I129 Hypertensive chronic kidney disease with stage 1 through stage 4 chronic kidney disease, or unspecified chronic kidney disease: Secondary | ICD-10-CM | POA: Diagnosis present

## 2024-06-27 DIAGNOSIS — E1022 Type 1 diabetes mellitus with diabetic chronic kidney disease: Secondary | ICD-10-CM | POA: Diagnosis present

## 2024-06-27 DIAGNOSIS — R569 Unspecified convulsions: Principal | ICD-10-CM

## 2024-06-27 DIAGNOSIS — Z808 Family history of malignant neoplasm of other organs or systems: Secondary | ICD-10-CM | POA: Diagnosis not present

## 2024-06-27 DIAGNOSIS — E869 Volume depletion, unspecified: Secondary | ICD-10-CM | POA: Diagnosis present

## 2024-06-27 DIAGNOSIS — Z79899 Other long term (current) drug therapy: Secondary | ICD-10-CM

## 2024-06-27 DIAGNOSIS — G40901 Epilepsy, unspecified, not intractable, with status epilepticus: Principal | ICD-10-CM | POA: Diagnosis present

## 2024-06-27 DIAGNOSIS — Z803 Family history of malignant neoplasm of breast: Secondary | ICD-10-CM | POA: Diagnosis not present

## 2024-06-27 DIAGNOSIS — I7389 Other specified peripheral vascular diseases: Secondary | ICD-10-CM

## 2024-06-27 DIAGNOSIS — R739 Hyperglycemia, unspecified: Secondary | ICD-10-CM

## 2024-06-27 DIAGNOSIS — W19XXXA Unspecified fall, initial encounter: Secondary | ICD-10-CM | POA: Diagnosis present

## 2024-06-27 DIAGNOSIS — M75112 Incomplete rotator cuff tear or rupture of left shoulder, not specified as traumatic: Secondary | ICD-10-CM | POA: Diagnosis present

## 2024-06-27 DIAGNOSIS — Z91199 Patient's noncompliance with other medical treatment and regimen due to unspecified reason: Secondary | ICD-10-CM | POA: Diagnosis not present

## 2024-06-27 DIAGNOSIS — Z8249 Family history of ischemic heart disease and other diseases of the circulatory system: Secondary | ICD-10-CM

## 2024-06-27 DIAGNOSIS — Z7984 Long term (current) use of oral hypoglycemic drugs: Secondary | ICD-10-CM

## 2024-06-27 DIAGNOSIS — Z91013 Allergy to seafood: Secondary | ICD-10-CM

## 2024-06-27 DIAGNOSIS — E872 Acidosis, unspecified: Secondary | ICD-10-CM | POA: Insufficient documentation

## 2024-06-27 DIAGNOSIS — M25512 Pain in left shoulder: Secondary | ICD-10-CM | POA: Diagnosis not present

## 2024-06-27 DIAGNOSIS — Z9641 Presence of insulin pump (external) (internal): Secondary | ICD-10-CM | POA: Diagnosis present

## 2024-06-27 LAB — I-STAT CHEM 8, ED
BUN: 28 mg/dL — ABNORMAL HIGH (ref 6–20)
Calcium, Ion: 1.1 mmol/L — ABNORMAL LOW (ref 1.15–1.40)
Chloride: 98 mmol/L (ref 98–111)
Creatinine, Ser: 1.3 mg/dL — ABNORMAL HIGH (ref 0.61–1.24)
Glucose, Bld: 685 mg/dL (ref 70–99)
HCT: 48 % (ref 39.0–52.0)
Hemoglobin: 16.3 g/dL (ref 13.0–17.0)
Potassium: 5.4 mmol/L — ABNORMAL HIGH (ref 3.5–5.1)
Sodium: 128 mmol/L — ABNORMAL LOW (ref 135–145)
TCO2: 18 mmol/L — ABNORMAL LOW (ref 22–32)

## 2024-06-27 LAB — RAPID URINE DRUG SCREEN, HOSP PERFORMED
Amphetamines: NOT DETECTED
Barbiturates: NOT DETECTED
Benzodiazepines: NOT DETECTED
Cocaine: NOT DETECTED
Opiates: NOT DETECTED
Tetrahydrocannabinol: NOT DETECTED

## 2024-06-27 LAB — TROPONIN I (HIGH SENSITIVITY)
Troponin I (High Sensitivity): 7 ng/L (ref <18)
Troponin I (High Sensitivity): 8 ng/L (ref <18)

## 2024-06-27 LAB — CBC
HCT: 44.1 % (ref 39.0–52.0)
Hemoglobin: 14.7 g/dL (ref 13.0–17.0)
MCH: 28.5 pg (ref 26.0–34.0)
MCHC: 33.3 g/dL (ref 30.0–36.0)
MCV: 85.6 fL (ref 80.0–100.0)
Platelets: 262 K/uL (ref 150–400)
RBC: 5.15 MIL/uL (ref 4.22–5.81)
RDW: 11.9 % (ref 11.5–15.5)
WBC: 5.3 K/uL (ref 4.0–10.5)
nRBC: 0 % (ref 0.0–0.2)

## 2024-06-27 LAB — URINALYSIS, W/ REFLEX TO CULTURE (INFECTION SUSPECTED)
Bacteria, UA: NONE SEEN
Bilirubin Urine: NEGATIVE
Glucose, UA: >=500 mg/dL — AB
Hgb urine dipstick: NEGATIVE
Ketones, ur: 5 mg/dL — AB
Leukocytes,Ua: NEGATIVE
Nitrite: NEGATIVE
Protein, ur: NEGATIVE mg/dL
Specific Gravity, Urine: 1.026 (ref 1.005–1.030)
pH: 5 (ref 5.0–8.0)

## 2024-06-27 LAB — BASIC METABOLIC PANEL WITH GFR
Anion gap: 18 — ABNORMAL HIGH (ref 5–15)
Anion gap: 12 (ref 5–15)
BUN: 25 mg/dL — ABNORMAL HIGH (ref 6–20)
BUN: 23 mg/dL — ABNORMAL HIGH (ref 6–20)
CO2: 16 mmol/L — ABNORMAL LOW (ref 22–32)
CO2: 20 mmol/L — ABNORMAL LOW (ref 22–32)
Calcium: 9.6 mg/dL (ref 8.9–10.3)
Calcium: 9.2 mg/dL (ref 8.9–10.3)
Chloride: 94 mmol/L — ABNORMAL LOW (ref 98–111)
Chloride: 99 mmol/L (ref 98–111)
Creatinine, Ser: 1.55 mg/dL — ABNORMAL HIGH (ref 0.61–1.24)
Creatinine, Ser: 1.23 mg/dL (ref 0.61–1.24)
GFR, Estimated: 52 mL/min — ABNORMAL LOW (ref >60)
GFR, Estimated: >60 mL/min (ref >60)
Glucose, Bld: 700 mg/dL (ref 70–99)
Glucose, Bld: 371 mg/dL — ABNORMAL HIGH (ref 70–99)
Potassium: 5.3 mmol/L — ABNORMAL HIGH (ref 3.5–5.1)
Potassium: 4.4 mmol/L (ref 3.5–5.1)
Sodium: 128 mmol/L — ABNORMAL LOW (ref 135–145)
Sodium: 131 mmol/L — ABNORMAL LOW (ref 135–145)

## 2024-06-27 LAB — I-STAT VENOUS BLOOD GAS, ED
Acid-base deficit: 1 mmol/L (ref 0.0–2.0)
Bicarbonate: 21.7 mmol/L (ref 20.0–28.0)
Calcium, Ion: 1.13 mmol/L — ABNORMAL LOW (ref 1.15–1.40)
HCT: 42 % (ref 39.0–52.0)
Hemoglobin: 14.3 g/dL (ref 13.0–17.0)
O2 Saturation: 98 %
Potassium: 4.4 mmol/L (ref 3.5–5.1)
Sodium: 132 mmol/L — ABNORMAL LOW (ref 135–145)
TCO2: 23 mmol/L (ref 22–32)
pCO2, Ven: 30.1 mmHg — ABNORMAL LOW (ref 44–60)
pH, Ven: 7.465 — ABNORMAL HIGH (ref 7.25–7.43)
pO2, Ven: 92 mmHg — ABNORMAL HIGH (ref 32–45)

## 2024-06-27 LAB — CBG MONITORING, ED
Glucose-Capillary: >600 mg/dL (ref 70–99)
Glucose-Capillary: 454 mg/dL — ABNORMAL HIGH (ref 70–99)
Glucose-Capillary: 375 mg/dL — ABNORMAL HIGH (ref 70–99)
Glucose-Capillary: 229 mg/dL — ABNORMAL HIGH (ref 70–99)

## 2024-06-27 LAB — I-STAT CG4 LACTIC ACID, ED
Lactic Acid, Venous: 7.5 mmol/L (ref 0.5–1.9)
Lactic Acid, Venous: 2.5 mmol/L (ref 0.5–1.9)

## 2024-06-27 LAB — BETA-HYDROXYBUTYRIC ACID: Beta-Hydroxybutyric Acid: 0.46 mmol/L — ABNORMAL HIGH (ref 0.05–0.27)

## 2024-06-27 LAB — OSMOLALITY: Osmolality: 307 mosm/kg — ABNORMAL HIGH (ref 275–295)

## 2024-06-27 LAB — GLUCOSE, CAPILLARY: Glucose-Capillary: 207 mg/dL — ABNORMAL HIGH (ref 70–99)

## 2024-06-27 MED ORDER — SODIUM CHLORIDE 0.9% FLUSH
3.0000 mL | Freq: Two times a day (BID) | INTRAVENOUS | Status: DC
  Administered 2024-06-27 – 2024-06-29 (×5): 3 mL via INTRAVENOUS

## 2024-06-27 MED ORDER — IOHEXOL 350 MG/ML SOLN
75.0000 mL | Freq: Once | INTRAVENOUS | Status: AC | PRN
  Administered 2024-06-27: 75 mL via INTRAVENOUS

## 2024-06-27 MED ORDER — OXYCODONE HCL 5 MG PO TABS
5.0000 mg | ORAL_TABLET | Freq: Four times a day (QID) | ORAL | Status: DC | PRN
  Administered 2024-06-27 – 2024-06-29 (×7): 5 mg via ORAL
  Filled 2024-06-27 (×6): qty 1

## 2024-06-27 MED ORDER — DEXTROSE IN LACTATED RINGERS 5 % IV SOLN: INTRAVENOUS | Status: DC

## 2024-06-27 MED ORDER — ALBUTEROL SULFATE (2.5 MG/3ML) 0.083% IN NEBU: 2.5000 mg | INHALATION_SOLUTION | RESPIRATORY_TRACT | Status: DC | PRN

## 2024-06-27 MED ORDER — MELATONIN 3 MG PO TABS: 6.0000 mg | ORAL_TABLET | Freq: Every evening | ORAL | Status: DC | PRN

## 2024-06-27 MED ORDER — POLYETHYLENE GLYCOL 3350 17 G PO PACK: 17.0000 g | PACK | Freq: Every day | ORAL | Status: DC | PRN

## 2024-06-27 MED ORDER — DEXTROSE 50 % IV SOLN
0.0000 mL | INTRAVENOUS | Status: DC | PRN
  Administered 2024-06-28: 50 mL via INTRAVENOUS

## 2024-06-27 MED ORDER — ONDANSETRON HCL 4 MG/2ML IJ SOLN: 4.0000 mg | Freq: Four times a day (QID) | INTRAMUSCULAR | Status: DC | PRN

## 2024-06-27 MED ORDER — INSULIN REGULAR(HUMAN) IN NACL 100-0.9 UT/100ML-% IV SOLN
INTRAVENOUS | Status: DC
  Administered 2024-06-27: 9.5 [IU]/h via INTRAVENOUS
  Filled 2024-06-27: qty 100

## 2024-06-27 MED ORDER — LACTATED RINGERS IV BOLUS
1000.0000 mL | Freq: Once | INTRAVENOUS | Status: AC
  Administered 2024-06-27: 1000 mL via INTRAVENOUS

## 2024-06-27 MED ORDER — CYCLOBENZAPRINE HCL 5 MG PO TABS
5.0000 mg | ORAL_TABLET | Freq: Every evening | ORAL | Status: DC | PRN
  Administered 2024-06-27 – 2024-06-29 (×3): 5 mg via ORAL
  Filled 2024-06-27 (×3): qty 1

## 2024-06-27 MED ORDER — LACTATED RINGERS IV SOLN: INTRAVENOUS | Status: DC

## 2024-06-27 MED ORDER — LOSARTAN POTASSIUM 50 MG PO TABS
100.0000 mg | ORAL_TABLET | Freq: Every day | ORAL | Status: DC
  Administered 2024-06-28 – 2024-06-30 (×3): 100 mg via ORAL
  Filled 2024-06-27 (×3): qty 2

## 2024-06-27 MED ORDER — DEXTROSE 50 % IV SOLN: 0.0000 mL | INTRAVENOUS | Status: DC | PRN

## 2024-06-27 MED ORDER — ACETAMINOPHEN 500 MG PO TABS
1000.0000 mg | ORAL_TABLET | Freq: Four times a day (QID) | ORAL | Status: DC | PRN
  Administered 2024-06-28: 1000 mg via ORAL
  Filled 2024-06-27: qty 2

## 2024-06-27 MED ORDER — INSULIN REGULAR(HUMAN) IN NACL 100-0.9 UT/100ML-% IV SOLN: INTRAVENOUS | Status: DC

## 2024-06-27 MED ORDER — OXYCODONE HCL 5 MG PO TABS
2.5000 mg | ORAL_TABLET | Freq: Four times a day (QID) | ORAL | Status: DC | PRN
  Filled 2024-06-27: qty 1

## 2024-06-27 NOTE — ED Triage Notes (Signed)
 Pt BIB [facility] from home d/t seizure. No hx of seizure. Per wife, pt seized for about 2 mins. Before he seized, the pt was right eye twitching like he lost vision on his right side. Pt bit his tongue. Bleeding controlled/ A&O X2. Pt was hypertensive in the 200s.   200/90 CBG 570

## 2024-06-27 NOTE — ED Provider Notes (Signed)
 [place] EMERGENCY DEPARTMENT AT [facility] Provider Note   CSN: [id number] Arrival date & time: 06/27/24  1608     Patient presents with: Seizures   Francisco Werner is a 58 y.o. male.   58 year old male with prior medical history as detailed below presents for evaluation.  Patient with reported seizure-like episode.  Patient was at home.  Patient reports that he felt like his right eye vision disappeared.  He also felt some twitching of his right eye.  Subsequently had a generalized tonic-clonic seizure that lasted approximately 2 minutes.  This was witnessed by his wife.  He did bite his tongue.  He was incontinent of urine.  EMS noted that his blood pressure was significantly elevated to 200s systolic initially.  On arrival the patient feels improved.  Complaints of not quite feeling right.  He also complains of lateral left shoulder pain.  The history is provided by the patient and medical records.       Prior to Admission medications   Medication Sig Start Date End Date Taking? Authorizing Provider  GAVILYTE-G 236 g solution See admin instructions. 12/04/17   [provider]  losartan  (COZAAR ) 50 MG tablet Take 50 mg by mouth daily.    [provider]  metFORMIN (GLUCOPHAGE) 500 MG tablet Take 500 mg by mouth 2 (two) times daily with a meal.    [provider]  Multiple Vitamin (MULTIVITAMIN) tablet Take 1 tablet by mouth 2 (two) times daily.    [provider]  pravastatin (PRAVACHOL) 10 MG tablet Take 10 mg by mouth daily.    [provider]  tiZANidine  (ZANAFLEX ) 4 MG tablet Take 1 tablet (4 mg total) by mouth every 6 (six) hours as needed for muscle spasms. 12/25/22   Lynwood Lenis, PA-C    Allergies: Shellfish-derived products    Review of Systems  All other systems reviewed and are negative.   Updated Vital Signs BP (!) 152/86   Pulse 94   Temp 98.6 F (37 C) (Oral)   Resp 14   SpO2 93%   Physical  Exam Vitals and nursing note reviewed.  Constitutional:      General: He is not in acute distress.    Appearance: Normal appearance. He is well-developed.  HENT:     Head: Normocephalic and atraumatic.     Mouth/Throat:     Comments: Small laceration to the tip of tongue.  No active bleeding. Eyes:     Conjunctiva/sclera: Conjunctivae normal.     Pupils: Pupils are equal, round, and reactive to light.  Cardiovascular:     Rate and Rhythm: Normal rate and regular rhythm.     Heart sounds: Normal heart sounds.  Pulmonary:     Effort: Pulmonary effort is normal. No respiratory distress.     Breath sounds: Normal breath sounds.  Abdominal:     General: There is no distension.     Palpations: Abdomen is soft.     Tenderness: There is no abdominal tenderness.  Musculoskeletal:        General: No deformity. Normal range of motion.     Cervical back: Normal range of motion and neck supple.     Comments: Mild tenderness to the lateral aspect of the left shoulder.  No clear dislocation.  Abduction greater than 45 degrees is painful.  Skin:    General: Skin is warm and dry.  Neurological:     General: No focal deficit present.     Mental  Status: He is alert and oriented to person, place, and time. Mental status is at baseline.     Cranial Nerves: No cranial nerve deficit.     Motor: No weakness.     (all labs ordered are listed, but only abnormal results are displayed) Labs Reviewed  BASIC METABOLIC PANEL WITH GFR - Abnormal; Notable for the following components:      Result Value   Sodium 128 (*)    Potassium 5.3 (*)    Chloride 94 (*)    CO2 16 (*)    Glucose, Bld 700 (*)    BUN 25 (*)    Creatinine, Ser 1.55 (*)    GFR, Estimated 52 (*)    Anion gap 18 (*)    All other components within normal limits  CBG MONITORING, ED - Abnormal; Notable for the following components:   Glucose-Capillary >600 (*)    All other components within normal limits  I-STAT CHEM 8, ED - Abnormal;  Notable for the following components:   Sodium 128 (*)    Potassium 5.4 (*)    BUN 28 (*)    Creatinine, Ser 1.30 (*)    Glucose, Bld 685 (*)    Calcium , Ion 1.10 (*)    TCO2 18 (*)    All other components within normal limits  I-STAT CG4 LACTIC ACID, ED - Abnormal; Notable for the following components:   Lactic Acid, Venous 7.5 (*)    All other components within normal limits  CBC  BETA-HYDROXYBUTYRIC ACID  RAPID URINE DRUG SCREEN, HOSP PERFORMED  URINALYSIS, W/ REFLEX TO CULTURE (INFECTION SUSPECTED)  URINALYSIS, ROUTINE W REFLEX MICROSCOPIC  CBG MONITORING, ED  TROPONIN I (HIGH SENSITIVITY)    EKG: EKG Interpretation Date/Time:  Saturday June 27 2024 16:21:13 EDT Ventricular Rate:  89 PR Interval:  188 QRS Duration:  114 QT Interval:  349 QTC Calculation: 425 R Axis:   11  Text Interpretation: Sinus rhythm Confirmed by Laurice Coy [id number]) on 06/27/2024 4:27:14 PM  Radiology: No results found.   Procedures   Medications Ordered in the ED  insulin  regular, human (MYXREDLIN ) 100 units/ 100 mL infusion (has no administration in time range)  lactated ringers  infusion (has no administration in time range)  dextrose  5 % in lactated ringers  infusion (has no administration in time range)  dextrose  50 % solution 0-50 mL (has no administration in time range)  lactated ringers  bolus 1,000 mL (1,000 mLs Intravenous New Bag/Given 06/27/24 1702)                                    Medical Decision Making Patient with reported seizure activity.  Describes symptoms are consistent with likely seizure.  Patient without prior history of seizure.  Patient at baseline mental status on arrival.  Workup demonstrates significant hyperglycemia.  CTA head and neck without acute abnormality noted.  Case briefly discussed with Dr. Matthews, neurology.  She feels that seizure is likely secondary to uncontrolled diabetes/hyperglycemia.  No indication at this time for antiepileptics.   She does not think that neurology needs to consult unless medicine service request the same.  Hospitalist service made aware of case and will evaluate for admission.  Amount and/or Complexity of Data Reviewed Labs: ordered. Radiology: ordered.  Risk Prescription drug management. Decision regarding hospitalization.   CRITICAL CARE Performed by: Coy JAYSON Laurice   Total critical care time: 45 minutes  Critical care time  was exclusive of separately billable procedures and treating other patients.  Critical care was necessary to treat or prevent imminent or life-threatening deterioration.  Critical care was time spent personally by me on the following activities: development of treatment plan with patient and/or surrogate as well as nursing, discussions with consultants, evaluation of patient's response to treatment, examination of patient, obtaining history from patient or surrogate, ordering and performing treatments and interventions, ordering and review of laboratory studies, ordering and review of radiographic studies, pulse oximetry and re-evaluation of patient's condition.      Final diagnoses:  Seizure [facility])  Hyperglycemia    ED Discharge Orders     None          Laurice Maude BROCKS, MD 06/27/24 2038

## 2024-06-27 NOTE — H&P (Addendum)
 History and Physical    Francisco Werner FMW:[id number] DOB: [birth year] DOA: 06/27/2024  PCP: Okey Carlin Redbird, MD   Patient coming from: Home   Chief Complaint:  Chief Complaint  Patient presents with   Seizures    HPI:  Francisco Werner is a 58 y.o. male with hx of DM type 1 on insulin  pump, HTN, HLD, followed with [facility] for DM management, recent poison oak exposure and Rx for Prednisone outpatient, who presented after first seizure. Per his report has not been using insulin  pump over the past 2 months, although has been taking Lantus  18 units intermittently just not on daily basis. Did take this in the morning. He also had recently been Rx'd prednisone for poison oak. Has not been monitoring sugars. He has never had diabetic crisis in the past.   Today after coming back from lunch he noted that the right visual field appeared to be digitized could see movement but things were scattered in boxes and circles. Then next thing he remembers was waking up to EMS over him. Per his wife he had on and off generalized tonic clonic seizures lasting for about 30 minutes. He had urinary incontinence, and tongue bite wound. Was briefly post ictal. No prior hx of seizures, no hx of trauma prior to the event.   After he mainly c/o L shoulder pain and difficulty with mobility, appears back to his baseline at time of my interview. There is no lasting VF abnormality and he has no speech changes, focal numbness or weakness.    Review of Systems:  ROS complete and negative except as marked above   Allergies  Allergen Reactions   Shellfish-Derived Products Swelling    Prior to Admission medications   Medication Sig Start Date End Date Taking? Authorizing Provider  GAVILYTE-G 236 g solution See admin instructions. 12/04/17   [provider]  losartan  (COZAAR ) 50 MG tablet Take 50 mg by mouth daily.    [provider]  metFORMIN (GLUCOPHAGE) 500 MG tablet Take 500 mg  by mouth 2 (two) times daily with a meal.    [provider]  Multiple Vitamin (MULTIVITAMIN) tablet Take 1 tablet by mouth 2 (two) times daily.    [provider]  pravastatin (PRAVACHOL) 10 MG tablet Take 10 mg by mouth daily.    [provider]  tiZANidine  (ZANAFLEX ) 4 MG tablet Take 1 tablet (4 mg total) by mouth every 6 (six) hours as needed for muscle spasms. 12/25/22   Lynwood Lenis, PA-C    Past Medical History:  Diagnosis Date   Diabetes mellitus without complication (HCC)    pre   Hyperlipidemia    Hypertension    borderline    History reviewed. No pertinent surgical history.   reports that he has never smoked. He has never used smokeless tobacco. He reports current alcohol use. He reports that he does not use drugs.  Family History  Problem Relation Age of Onset   Diabetes Mother    Cancer Mother        breast   Cancer Father        appendix   Diabetes Father    Hypertension Father      Physical Exam: Vitals:   06/27/24 1620 06/27/24 1623  BP: (!) 152/86   Pulse: 94   Resp: 14   Temp:  98.6 F (37 C)  TempSrc:  Oral  SpO2: 93%     Gen: Awake, alert, NAD   HEENT: there  is an irregular tongue bite wound which appears superficial, on the R lateral aspect of the tongue, hemostatic.  CV: Regular, normal S1, S2, no murmurs  Resp: Normal WOB, CTAB  Abd: Flat, normoactive, nontender MSK: L shoulder is held in Abduction, there is tenderness along the [facility] joint and near greater tuberosity, painful active ROM although PROM more mild, there is limitation in initial Abduction which improves over 30 deg. LE Symmetric, no edema  Skin: No rashes or lesions to exposed skin  Neuro: Alert and interactive, fully oriented, CN 2- 12 intact, motor is 5/5 and symmetric, sensation is intact and equal to fine touch  Psych: euthymic, appropriate    Data review:   Labs reviewed, notable for:   NA 128 (uncorrected), K 5.3  Initial blood glucose 700,  Bicarb 16, AG 18, lactate 7.5, VBG pending, serum osm pending, beta hydroxybutyrate 0.46, UA 5 mg/DL ketone HS trop 7   Micro:  No results found for this or any previous visit.  Imaging reviewed:  DG Shoulder Left Result Date: 06/27/2024 EXAM: 1 VIEW XRAY OF THE LEFT SHOULDER 06/27/2024 06:17:08 PM COMPARISON: None available. CLINICAL HISTORY: Pain post seizure. FINDINGS: BONES AND JOINTS: Glenohumeral joint is normally aligned. No acute fracture or dislocation. Mild degenerative changes of the acromioclavicular joint. SOFT TISSUES: No abnormal calcifications. Visualized lung is unremarkable. IMPRESSION: 1. No acute findings. 2. Mild degenerative changes of the acromioclavicular joint. Electronically signed by: Lonni Necessary MD 06/27/2024 06:33 PM EDT RP Workstation: [id number]   CT ANGIO HEAD NECK W WO CM Result Date: 06/27/2024 EXAM: CT HEAD WITHOUT CTA HEAD AND NECK WITH AND WITHOUT 06/27/2024 06:08:49 PM TECHNIQUE: CTA of the head and neck was performed with and without the administration of intravenous contrast. Noncontrast CT of the head with reconstructed 2-D images are also provided for review. Multiplanar 2D and/or 3D reformatted images are provided for review. Automated exposure control, iterative reconstruction, and/or weight based adjustment of the mA/kV was utilized to reduce the radiation dose to as low as reasonably achievable. COMPARISON: None available CLINICAL HISTORY: Transient ischemic attack (TIA). Seizure. FINDINGS: CT HEAD: BRAIN AND VENTRICLES: No acute intracranial hemorrhage. No mass effect or midline shift. No extra-axial fluid collection. Gray-white differentiation is maintained. No hydrocephalus. Periventricular white matter hypoattenuation is mildly advanced for age. ORBITS: No acute abnormality. SINUSES: No acute abnormality. SOFT TISSUES AND SKULL: No acute abnormality. CTA NECK: AORTIC ARCH AND ARCH VESSELS: No dissection or arterial injury. No significant stenosis  of the brachiocephalic or subclavian arteries. Common origin of the left common carotid and dominant artery is noted. CERVICAL CAROTID ARTERIES: No dissection, arterial injury, or hemodynamically significant stenosis by NASCET criteria. CERVICAL VERTEBRAL ARTERIES: No dissection, arterial injury, or significant stenosis. The right vertebral artery is dominant. VISUALIZED LUNGS AND MEDIASTINUM: Unremarkable. SOFT TISSUES: No acute abnormality. BONES: Mild degenerative retrolisthesis is present at C5-6. CTA HEAD: ANTERIOR CIRCULATION: No significant stenosis of the internal carotid arteries. No significant stenosis of the anterior cerebral arteries. No significant stenosis of the middle cerebral arteries. Mild segmental irregularity is present in the distal MCA branch vessels without a significant proximal stenosis or occlusion. No aneurysm. POSTERIOR CIRCULATION: No significant stenosis of the posterior cerebral arteries. No significant stenosis of the basilar artery. No significant stenosis of the vertebral arteries. Mild narrowing of distal PCA branch vessels is present bilaterally. No aneurysm. OTHER: No dural venous sinus thrombosis on this non-dedicated study. IMPRESSION: 1. No acute intracranial abnormality. 2. Mildly advanced periventricular white matter hypoattenuation for age. This  most likely reflects the sequelae of chronic microvascular ischemia. 3. Mild segmental irregularity in the distal MCA branch vessels without significant proximal stenosis or occlusion. Mild narrowing of distal PCA branch vessels bilaterally. Findings suggest intracranial atherosclerosis 4. Normal CTA of the neck. 5. No significant proximal stenosis or occlusion within the Circle of Willis. Electronically signed by: Lonni Necessary MD 06/27/2024 06:33 PM EDT RP Workstation: [id number]   DG Chest Portable 1 View Result Date: 06/27/2024 CLINICAL DATA:  Seizure EXAM: PORTABLE CHEST 1 VIEW COMPARISON:  Chest x-ray 10/03/2011  FINDINGS: The heart size and mediastinal contours are within normal limits. Both lungs are clear. The visualized skeletal structures are unremarkable. IMPRESSION: No active disease. Electronically Signed   By: Greig Pique M.D.   On: 06/27/2024 18:27    EKG:  Personally reviewed, sinus rhythm, LAE, PRWP, suspect repolarization changes anterior laterally, J-point notching, minimal ST scooping inferiorly.   ED Course:  Treated with 1 L LR, started on insulin  gtt with mIVF. EDP discussed with neurology Dr. Matthews recommended holding of on AED and treating for underlying hyperglycemia   Assessment/Plan:  58 y.o. male with hx DM type 1 on insulin  pump, HTN, HLD, CKD stage II, followed with [facility] for DM management, recent poison oak exposure and Rx for Prednisone outpatient, who presented after first seizure, appears provoked in setting of DKA / HHS   First seizure in adult, status epillepticus spontaneously aborted  Developed R VF scotoma then followed by generalized tonic clonic seizure off and on without return to baseline for 30 min. By EMS arrival had aborted and was post ictal. + incontinence. + tongue bite wound which is irregular, hemostatic on eval. Lab c/w diabetic crisis per below. CTA Head and neck with no acute intracranial abnormality, mildly advanced likely microvascular ischemic changes, mild segmental MCA irregularly, and distal PCA bilaterally likely intracranial atherosclerosis. Suspect seizure provoked in setting of diabetic crisis.  -- EDP d/w neurology Dr. Matthews recommended holding of on AED and treating for underlying hyperglycemia.  -- Routine EEG, MRI brain to eval for underlying susceptibility for seizure.  -- Seizure precautions  -- Needs instructions on activity restrictions   DKA / HHS spectrum  Hx DM type 1, on insulin  pump; home settings unclear per OP notes; Rx is for Lantus  18 units when off pump. Initial blood glucose 700, Bicarb 16, AG 18, lactate 7.5,  VBG pending, serum osm pending, beta hydroxybutyrate 0.46, UA 5 mg/DL ketone. A1c pending. Trigger suspect uncontrolled hyperglycemia with recent steroid Rx as outpatient. Favor more on HHS side with acidosis related to lactic acidosis from seizure, has minimal ketosis present.  -Insulin  drip, HHS protocol -S/p 1L IV fluid, given additional 1 L, then continue mIVF per protocol    -BMP q4 hr, Beta OH butyrate q 8 hr, VBG pending, sOsm pending -K repletion as needed  -DM educator c/s  -N.p.o. except water for now   Lactic acidosis  Likely related to seizure, volume depletion  -- IVF per above, trend lactate   Suspect rotator cuff tear, L shoulder  Exam with difficulty with initial Abduction which improves with time, ? Supraspinatus involvement possibly. Xr without acute finding.  -- Symptomatic mgmt tylenol  prn, flexeril  at bedtime prn, oxycodone  2.5 / 5 mg prn for mod /severe  -- Discussed he should have an MRI of the left shoulder and see orthopedic surgery as outpatient.   Incidental findings:  Intracranial atherosclerosis, mild   Chronic medical problems  HTN: Resume Losartan  tomorrow (took PTA).  If uncontrolled in meantime can use Labetolol 10 mg IV prn for SBP > 180 (as long as MRI negative).  HLD: Not on cholesterol lowering medication  CKD stage II: Baseline Cr ~ 1.1 per OP endo note, up to 1.5 on admission with borderline AKI, improving.     There is no height or weight on file to calculate BMI.    DVT prophylaxis: SCD  Code Status:  Full Code Diet:  Diet Orders (From admission, onward)     Start     Ordered   06/27/24 1731  Diet NPO time specified  Diet effective now        06/27/24 1732           Family Communication:  Yes discussed with wife, son at bedside   Consults:  None   Admission status:   Inpatient, Step Down Unit  Severity of Illness: The appropriate patient status for this patient is INPATIENT. Inpatient status is judged to be reasonable and  necessary in order to provide the required intensity of service to ensure the patient's safety. The patient's presenting symptoms, physical exam findings, and initial radiographic and laboratory data in the context of their chronic comorbidities is felt to place them at high risk for further clinical deterioration. Furthermore, it is not anticipated that the patient will be medically stable for discharge from the hospital within 2 midnights of admission.   * I certify that at the point of admission it is my clinical judgment that the patient will require inpatient hospital care spanning beyond 2 midnights from the point of admission due to high intensity of service, high risk for further deterioration and high frequency of surveillance required.*   Francisco Dawson, MD [facility]  How to contact the [facility] Attending or Consulting provider 7A - 7P or covering provider during after hours 7P -7A, for this patient.  Check the care team in [facility] and look for a) attending/consulting [facility] provider listed and b) the [facility] team listed Log into [email or web address] and use [facility] universal password to access. If you do not have the password, please contact the hospital operator. Locate the [facility] provider you are looking for under [facility] and page to a number that you can be directly reached. If you still have difficulty reaching the provider, please page the [facility] (Director on Call) for the Hospitalists listed on amion for assistance.  06/27/2024, 7:18 PM

## 2024-06-27 NOTE — ED Notes (Signed)
 CCMD called and pt put on monitor.

## 2024-06-27 NOTE — ED Notes (Signed)
 Pt and wife unable to locate pt's Apple watch although locator states it is at this address. Attempted to find multiple times with assisting wife to used linen carts but no ping heard. Linen attendant reports they will make supervisor aware so outsourced linen company can look for it as well.

## 2024-06-27 NOTE — ED Notes (Signed)
 Cbg 700. MD made aware.

## 2024-06-28 ENCOUNTER — Inpatient Hospital Stay (HOSPITAL_COMMUNITY)

## 2024-06-28 DIAGNOSIS — R569 Unspecified convulsions: Secondary | ICD-10-CM

## 2024-06-28 LAB — LIPID PANEL
Cholesterol: 210 mg/dL — ABNORMAL HIGH (ref 0–200)
HDL: 78 mg/dL (ref >40)
LDL Cholesterol: 114 mg/dL — ABNORMAL HIGH (ref 0–99)
Total CHOL/HDL Ratio: 2.7 ratio
Triglycerides: 90 mg/dL (ref <150)
VLDL: 18 mg/dL (ref 0–40)

## 2024-06-28 LAB — GLUCOSE, CAPILLARY
Glucose-Capillary: 135 mg/dL — ABNORMAL HIGH (ref 70–99)
Glucose-Capillary: 147 mg/dL — ABNORMAL HIGH (ref 70–99)
Glucose-Capillary: 154 mg/dL — ABNORMAL HIGH (ref 70–99)
Glucose-Capillary: 170 mg/dL — ABNORMAL HIGH (ref 70–99)
Glucose-Capillary: 177 mg/dL — ABNORMAL HIGH (ref 70–99)
Glucose-Capillary: 208 mg/dL — ABNORMAL HIGH (ref 70–99)
Glucose-Capillary: 217 mg/dL — ABNORMAL HIGH (ref 70–99)
Glucose-Capillary: 221 mg/dL — ABNORMAL HIGH (ref 70–99)
Glucose-Capillary: 234 mg/dL — ABNORMAL HIGH (ref 70–99)
Glucose-Capillary: 258 mg/dL — ABNORMAL HIGH (ref 70–99)
Glucose-Capillary: 296 mg/dL — ABNORMAL HIGH (ref 70–99)
Glucose-Capillary: 60 mg/dL — ABNORMAL LOW (ref 70–99)

## 2024-06-28 LAB — COMPREHENSIVE METABOLIC PANEL WITH GFR
ALT: 22 U/L (ref 0–44)
AST: 21 U/L (ref 15–41)
Albumin: 3.4 g/dL — ABNORMAL LOW (ref 3.5–5.0)
Alkaline Phosphatase: 49 U/L (ref 38–126)
Anion gap: 9 (ref 5–15)
BUN: 12 mg/dL (ref 6–20)
CO2: 24 mmol/L (ref 22–32)
Calcium: 8.7 mg/dL — ABNORMAL LOW (ref 8.9–10.3)
Chloride: 102 mmol/L (ref 98–111)
Creatinine, Ser: 1.08 mg/dL (ref 0.61–1.24)
GFR, Estimated: >60 mL/min (ref >60)
Glucose, Bld: 177 mg/dL — ABNORMAL HIGH (ref 70–99)
Potassium: 3.8 mmol/L (ref 3.5–5.1)
Sodium: 135 mmol/L (ref 135–145)
Total Bilirubin: 1 mg/dL (ref 0.0–1.2)
Total Protein: 6.2 g/dL — ABNORMAL LOW (ref 6.5–8.1)

## 2024-06-28 LAB — CBC WITH DIFFERENTIAL/PLATELET
Abs Immature Granulocytes: 0.08 K/uL — ABNORMAL HIGH (ref 0.00–0.07)
Basophils Absolute: 0 K/uL (ref 0.0–0.1)
Basophils Relative: 0 %
Eosinophils Absolute: 0 K/uL (ref 0.0–0.5)
Eosinophils Relative: 0 %
HCT: 38.7 % — ABNORMAL LOW (ref 39.0–52.0)
Hemoglobin: 13 g/dL (ref 13.0–17.0)
Immature Granulocytes: 1 %
Lymphocytes Relative: 13 %
Lymphs Abs: 1.5 K/uL (ref 0.7–4.0)
MCH: 28.3 pg (ref 26.0–34.0)
MCHC: 33.6 g/dL (ref 30.0–36.0)
MCV: 84.3 fL (ref 80.0–100.0)
Monocytes Absolute: 1.2 K/uL — ABNORMAL HIGH (ref 0.1–1.0)
Monocytes Relative: 10 %
Neutro Abs: 9 K/uL — ABNORMAL HIGH (ref 1.7–7.7)
Neutrophils Relative %: 76 %
Platelets: 224 K/uL (ref 150–400)
RBC: 4.59 MIL/uL (ref 4.22–5.81)
RDW: 12.1 % (ref 11.5–15.5)
WBC: 11.8 K/uL — ABNORMAL HIGH (ref 4.0–10.5)
nRBC: 0 % (ref 0.0–0.2)

## 2024-06-28 LAB — BETA-HYDROXYBUTYRIC ACID
Beta-Hydroxybutyric Acid: 0.14 mmol/L (ref 0.05–0.27)
Beta-Hydroxybutyric Acid: 0.81 mmol/L — ABNORMAL HIGH (ref 0.05–0.27)

## 2024-06-28 LAB — BLOOD GAS, VENOUS
Acid-Base Excess: 1 mmol/L (ref 0.0–2.0)
Bicarbonate: 25.2 mmol/L (ref 20.0–28.0)
O2 Saturation: 91.6 %
Patient temperature: 37
pCO2, Ven: 38 mmHg — ABNORMAL LOW (ref 44–60)
pH, Ven: 7.43 (ref 7.25–7.43)
pO2, Ven: 58 mmHg — ABNORMAL HIGH (ref 32–45)

## 2024-06-28 LAB — HEMOGLOBIN A1C
Hgb A1c MFr Bld: 12.2 % — ABNORMAL HIGH (ref 4.8–5.6)
Mean Plasma Glucose: 303.44 mg/dL

## 2024-06-28 LAB — BASIC METABOLIC PANEL WITH GFR
Anion gap: 12 (ref 5–15)
BUN: 16 mg/dL (ref 6–20)
CO2: 21 mmol/L — ABNORMAL LOW (ref 22–32)
Calcium: 9.1 mg/dL (ref 8.9–10.3)
Chloride: 102 mmol/L (ref 98–111)
Creatinine, Ser: 1.04 mg/dL (ref 0.61–1.24)
GFR, Estimated: >60 mL/min (ref >60)
Glucose, Bld: 209 mg/dL — ABNORMAL HIGH (ref 70–99)
Potassium: 3.9 mmol/L (ref 3.5–5.1)
Sodium: 135 mmol/L (ref 135–145)

## 2024-06-28 LAB — OSMOLALITY: Osmolality: 294 mosm/kg (ref 275–295)

## 2024-06-28 LAB — MAGNESIUM: Magnesium: 1.9 mg/dL (ref 1.7–2.4)

## 2024-06-28 LAB — AMMONIA: Ammonia: 36 umol/L — ABNORMAL HIGH (ref 9–35)

## 2024-06-28 LAB — PHOSPHORUS: Phosphorus: 3.9 mg/dL (ref 2.5–4.6)

## 2024-06-28 LAB — HIV ANTIBODY (ROUTINE TESTING W REFLEX): HIV Screen 4th Generation wRfx: NONREACTIVE

## 2024-06-28 LAB — TSH: TSH: 0.401 u[IU]/mL (ref 0.350–4.500)

## 2024-06-28 LAB — LACTIC ACID, PLASMA: Lactic Acid, Venous: 1.1 mmol/L (ref 0.5–1.9)

## 2024-06-28 MED ORDER — HYDRALAZINE HCL 20 MG/ML IJ SOLN: 10.0000 mg | Freq: Four times a day (QID) | INTRAMUSCULAR | Status: DC | PRN

## 2024-06-28 MED ORDER — POTASSIUM CHLORIDE 10 MEQ/100ML IV SOLN
10.0000 meq | INTRAVENOUS | Status: AC
  Administered 2024-06-28 (×2): 10 meq via INTRAVENOUS
  Filled 2024-06-28 (×2): qty 100

## 2024-06-28 MED ORDER — INSULIN GLARGINE-YFGN 100 UNIT/ML ~~LOC~~ SOLN
18.0000 [IU] | Freq: Every day | SUBCUTANEOUS | Status: DC
  Administered 2024-06-28 – 2024-06-29 (×2): 18 [IU] via SUBCUTANEOUS
  Filled 2024-06-28 (×2): qty 0.18

## 2024-06-28 MED ORDER — INSULIN ASPART 100 UNIT/ML IJ SOLN
0.0000 [IU] | Freq: Every day | INTRAMUSCULAR | Status: DC
  Administered 2024-06-28 – 2024-06-29 (×2): 2 [IU] via SUBCUTANEOUS

## 2024-06-28 MED ORDER — INSULIN ASPART 100 UNIT/ML IJ SOLN
0.0000 [IU] | Freq: Three times a day (TID) | INTRAMUSCULAR | Status: DC
  Administered 2024-06-28: 8 [IU] via SUBCUTANEOUS
  Administered 2024-06-28 – 2024-06-29 (×2): 5 [IU] via SUBCUTANEOUS
  Administered 2024-06-29: 3 [IU] via SUBCUTANEOUS
  Administered 2024-06-29: 5 [IU] via SUBCUTANEOUS
  Administered 2024-06-30: 3 [IU] via SUBCUTANEOUS

## 2024-06-28 MED ORDER — ATORVASTATIN CALCIUM 10 MG PO TABS
10.0000 mg | ORAL_TABLET | Freq: Every day | ORAL | Status: DC
  Administered 2024-06-28: 10 mg via ORAL
  Filled 2024-06-28: qty 1

## 2024-06-28 MED ORDER — ASPIRIN 81 MG PO CHEW
81.0000 mg | CHEWABLE_TABLET | Freq: Every day | ORAL | Status: DC
  Administered 2024-06-28 – 2024-06-30 (×3): 81 mg via ORAL
  Filled 2024-06-28 (×3): qty 1

## 2024-06-28 MED ORDER — LACTATED RINGERS IV SOLN: INTRAVENOUS | Status: DC

## 2024-06-28 MED ORDER — DEXTROSE 50 % IV SOLN
INTRAVENOUS | Status: AC
  Administered 2024-06-28: 30 mL via INTRAVENOUS
  Filled 2024-06-28: qty 50

## 2024-06-28 MED ORDER — HEPARIN SODIUM (PORCINE) 5000 UNIT/ML IJ SOLN
5000.0000 [IU] | Freq: Three times a day (TID) | INTRAMUSCULAR | Status: DC
  Administered 2024-06-28 – 2024-06-30 (×5): 5000 [IU] via SUBCUTANEOUS
  Filled 2024-06-28 (×5): qty 1

## 2024-06-28 MED ORDER — MAGIC MOUTHWASH W/LIDOCAINE: 5.0000 mL | Freq: Four times a day (QID) | ORAL | Status: DC | PRN

## 2024-06-28 MED ORDER — ROSUVASTATIN CALCIUM 5 MG PO TABS
10.0000 mg | ORAL_TABLET | Freq: Every day | ORAL | Status: DC
  Administered 2024-06-28 – 2024-06-30 (×3): 10 mg via ORAL
  Filled 2024-06-28 (×3): qty 2

## 2024-06-28 MED ORDER — FAMOTIDINE 20 MG PO TABS
20.0000 mg | ORAL_TABLET | Freq: Every day | ORAL | Status: DC
  Administered 2024-06-28 – 2024-06-30 (×3): 20 mg via ORAL
  Filled 2024-06-28 (×3): qty 1

## 2024-06-28 NOTE — Progress Notes (Signed)
 PROGRESS NOTE                                                                                                                                                                                                             Patient Demographics:    Francisco Werner, is a 58 y.o. male, DOB - [birth year], FMW:[id number]  Outpatient Primary MD for the patient is Francisco Carlin Redbird, MD    LOS - 1  Admit date - 06/27/2024    Chief Complaint  Patient presents with   Seizures       Brief Narrative (HPI from H&P)   58 y.o. male with hx of DM type 1 on insulin  pump, HTN, HLD, followed with [facility] Endocrinology for DM management, recent poison oak exposure and Rx for Prednisone outpatient, who presented after first seizure. Per his report has not been using insulin  pump over the past 2 months, although has been taking Lantus  18 units intermittently just not on daily basis. Did take this in the morning. He also had recently been Rx'd prednisone for poison oak. Has not been monitoring sugars. He has never had diabetic crisis in the past.  Patient had an episode of seizure with urinary incontinence and tongue bite, was brought to the ER for further care was found to be in DKA.   Subjective:    Francisco Werner today has, No headache, No chest pain, No abdominal pain - No Nausea, No new weakness tingling or numbness, shortness of breath but intense left shoulder pain and discomfort, cannot move it.   Assessment  & Plan :   Status epilepticus due to likely metabolic derangements rising from DKA.  MRI brain, CT angiogram head and neck and EEG nonacute, he does have advanced intracranial atherosclerosis for which she will require strict secondary prevention and will be placed on aspirin  and statin, case discussed with neurologist on-call by the admitting physician and by me with Francisco Werner, no AEDs for now if no further seizures.  Continue to monitor.  DKA  in a patient with DM type I.  Diabetic and insulin  education, treated with DKA protocol, transition to subcu insulin  and monitor.  Lab Results  Component Value Date   HGBA1C 12.2 (H) 06/28/2024   CBG (last 3)  Recent Labs    06/28/24 0634 06/28/24 0751 06/28/24 0910  GLUCAP 170*  177* 147*   Ongoing left shoulder pain.  Incurred during the fall from seizure, highly suspicious for rotator cuff injury, x-ray stable, obtain MRI and orthopedic input.     Lactic acidosis.  Due to above.  Advanced intracranial atherosclerosis.  Placed on aspirin  and statin.  Outpatient secondary prevention monitoring.  Hypertension.  ARB and as needed hydralazine .       Condition -fair  Family Communication  : None present  Code Status : Full code  Consults  : Neurology over the phone, orthopedics  PUD Prophylaxis :    Procedures  :     MRI left shoulder.    EEG.  Nonacute  MRI brain.  Nonacute but some chronic microvascular changes.  CT head and CT angiogram head and neck.1. No acute intracranial abnormality. 2. Mildly advanced periventricular white matter hypoattenuation for age. This most likely reflects the sequelae of chronic microvascular ischemia. 3. Mild segmental irregularity in the distal MCA branch vessels without significant proximal stenosis or occlusion. Mild narrowing of distal PCA branch vessels bilaterally. Findings suggest intracranial atherosclerosis 4. Normal CTA of the neck. 5. No significant proximal stenosis or occlusion within the Circle of Willis      Disposition Plan  :    Status is: Inpatient   DVT Prophylaxis  :  Heparin   heparin  injection 5,000 Units Start: 06/28/24 1400 SCDs Start: 06/27/24 1945    Lab Results  Component Value Date   PLT 224 06/28/2024    Diet :  Diet Order             Diet Carb Modified Fluid consistency: Thin; Room service appropriate? Yes  Diet effective now                    Inpatient Medications  Scheduled  Meds:  aspirin   81 mg Oral Daily   famotidine   20 mg Oral Daily   heparin  injection (subcutaneous)  5,000 Units Subcutaneous Q8H   insulin  aspart  0-15 Units Subcutaneous TID WC   insulin  aspart  0-5 Units Subcutaneous QHS   insulin  glargine-yfgn  18 Units Subcutaneous Daily   losartan   100 mg Oral Daily   rosuvastatin   10 mg Oral Daily   sodium chloride  flush  3 mL Intravenous Q12H   Continuous Infusions: PRN Meds:.acetaminophen , albuterol , cyclobenzaprine , dextrose , melatonin, ondansetron  (ZOFRAN ) IV, oxyCODONE  **OR** oxyCODONE , polyethylene glycol  Antibiotics  :    Anti-infectives (From admission, onward)    None         Objective:   Vitals:   06/28/24 0000 06/28/24 0400 06/28/24 0427 06/28/24 0450  BP: (!) 159/73  (!) 131/55   Pulse: 70  (!) 59   Resp: (!) 25  (!) 25   Temp: 98 F (36.7 C) 98 F (36.7 C) 97.6 F (36.4 C)   TempSrc:      SpO2:      Weight:    96 kg  Height:        Wt Readings from Last 3 Encounters:  06/28/24 96 kg  09/30/14 101.6 kg  08/11/14 105.1 kg     Intake/Output Summary (Last 24 hours) at 06/28/2024 0957 Last data filed at 06/28/2024 9373 Gross per 24 hour  Intake 2346.71 ml  Output --  Net 2346.71 ml     Physical Exam  Awake Alert, No new F.N deficits, Normal affect [place].AT,PERRAL Supple Neck, No JVD,   Symmetrical Chest wall movement, Good air movement bilaterally, CTAB RRR,No Gallops,Rubs or new Murmurs,  +ve B.Sounds,  Abd Soft, No tenderness,   Intense left shoulder pain with decreased range of motion    Data Review:    Recent Labs  Lab 06/27/24 1620 06/27/24 1639 06/27/24 1955 06/28/24 0753  WBC 5.3  --   --  11.8*  HGB 14.7 16.3 14.3 13.0  HCT 44.1 48.0 42.0 38.7*  PLT 262  --   --  224  MCV 85.6  --   --  84.3  MCH 28.5  --   --  28.3  MCHC 33.3  --   --  33.6  RDW 11.9  --   --  12.1  LYMPHSABS  --   --   --  1.5  MONOABS  --   --   --  1.2*  EOSABS  --   --   --  0.0  BASOSABS  --   --   --  0.0     Recent Labs  Lab 06/27/24 1620 06/27/24 1639 06/27/24 1945 06/27/24 1953 06/27/24 1955 06/28/24 0009 06/28/24 0753  NA 128* 128* 131*  --  132* 135 135  K 5.3* 5.4* 4.4  --  4.4 3.9 3.8  CL 94* 98 99  --   --  102 102  CO2 16*  --  20*  --   --  21* 24  ANIONGAP 18*  --  12  --   --  12 9  GLUCOSE 700* 685* 371*  --   --  209* 177*  BUN 25* 28* 23*  --   --  16 12  CREATININE 1.55* 1.30* 1.23  --   --  1.04 1.08  AST  --   --   --   --   --   --  21  ALT  --   --   --   --   --   --  22  ALKPHOS  --   --   --   --   --   --  49  BILITOT  --   --   --   --   --   --  1.0  ALBUMIN  --   --   --   --   --   --  3.4*  LATICACIDVEN  --  7.5*  --  2.5*  --   --  1.1  TSH  --   --   --   --   --   --  0.401  HGBA1C  --   --   --   --   --   --  12.2*  AMMONIA  --   --   --   --   --   --  36*  MG  --   --   --   --   --   --  1.9  PHOS  --   --   --   --   --   --  3.9  CALCIUM  9.6  --  9.2  --   --  9.1 8.7*      Recent Labs  Lab 06/27/24 1620 06/27/24 1639 06/27/24 1945 06/27/24 1953 06/28/24 0009 06/28/24 0753  LATICACIDVEN  --  7.5*  --  2.5*  --  1.1  TSH  --   --   --   --   --  0.401  HGBA1C  --   --   --   --   --  12.2*  AMMONIA  --   --   --   --   --  36*  MG  --   --   --   --   --  1.9  CALCIUM  9.6  --  9.2  --  9.1 8.7*    --------------------------------------------------------------------------------------------------------------- No results found for: CHOL, HDL, LDLCALC, LDLDIRECT, TRIG, CHOLHDL  Lab Results  Component Value Date   HGBA1C 12.2 (H) 06/28/2024   Recent Labs    06/28/24 0753  TSH 0.401   No results for input(s): VITAMINB12, FOLATE, FERRITIN, TIBC, IRON, RETICCTPCT in the last 72 hours. ------------------------------------------------------------------------------------------------------------------ Cardiac Enzymes No results for input(s): CKMB, TROPONINI, MYOGLOBIN in the last 168  hours.  Invalid input(s): CK  Micro Results No results found for this or any previous visit (from the past 240 hours).  Radiology Report EEG adult Result Date: 06/28/2024 Shelton Arlin KIDD, MD     06/28/2024  7:06 AM Patient Name: GIOMAR GUSLER MRN: [id number] Epilepsy Attending: Arlin KIDD Shelton Referring Physician/Provider: Keturah Carrier, MD Date: 06/28/2024 Duration: 23.43 mins Patient history:  58 y.o. male with hx of DM type 1 on insulin  pump, HTN, HLD, followed with [facility] Endocrin[facility] for DM management, recent poison oak exposure and Rx for Prednisone outpatient, who presented after first seizure. EEG to evaluate for seizure Level of alertness: Awake, asleep AEDs during EEG study: None Technical aspects: This EEG study was done with scalp electrodes positioned according to the 10-20 International system of electrode placement. Electrical activity was reviewed with band pass filter of 1-70Hz , sensitivity of 7 uV/mm, display speed of 38mm/sec with a 60Hz  notched filter applied as appropriate. EEG data were recorded continuously and digitally stored.  Video monitoring was available and reviewed as appropriate. Description: The posterior dominant rhythm consists of 8-9Hz  activity of moderate voltage (25-35 uV) seen predominantly in posterior head regions, symmetric and reactive to eye opening and eye closing. Sleep was characterized by vertex waves, sleep spindles (12 to 14 Hz), maximal frontocentral region. Hyperventilation and photic stimulation were not performed.   IMPRESSION: This study is within normal limits. No seizures or epileptiform discharges were seen throughout the recording. A normal interictal EEG does not exclude the diagnosis of epilepsy. Arlin KIDD Shelton   MR BRAIN WO CONTRAST Result Date: 06/28/2024 CLINICAL DATA:  Initial evaluation for new onset seizure. EXAM: MRI HEAD WITHOUT CONTRAST TECHNIQUE: Multiplanar, multiecho pulse sequences of the brain and surrounding structures  were obtained without intravenous contrast. COMPARISON:  Prior CT from 06/27/2024 FINDINGS: Brain: Mild diffuse prominence of the CSF and disease compatible generalized cerebral atrophy. Scattered patchy T2/FLAIR hyperintensity involving the periventricular deep white matter both cerebral hemispheres, most characteristic of chronic microvascular ischemic disease, mild-to-moderate in nature. FLAIR signal intensity involving the periventricular white matter adjacent to the temporal horns of both lateral ventricles noted, likely small vessel related. No abnormal foci of restricted diffusion to suggest acute or subacute ischemia. No signal changes to suggest postictal changes. Gray-white matter differentiation maintained. No areas of chronic cortical infarction. No acute or chronic intracranial blood products. No mass lesion, midline shift or mass effect. No hydrocephalus or extra-axial fluid collection. Pituitary gland and suprasellar region within normal limits. No intrinsic temporal lobe abnormality. Vascular: Major intracranial vascular flow voids are maintained. Skull and upper cervical spine: Craniocervical junction within normal limits. Bone marrow signal intensity overall within normal limits. No scalp soft tissue abnormality. Sinuses/Orbits: Globes and orbital soft tissues within normal limits. Paranasal sinuses are largely clear. No mastoid effusion. Other: None. IMPRESSION: 1. No acute intracranial abnormality. 2. Mild to moderate cerebral white matter disease, most characteristic  of chronic microvascular ischemic disease. Electronically Signed   By: Morene Hoard M.D.   On: 06/28/2024 02:49   DG Shoulder Left Result Date: 06/27/2024 EXAM: 1 VIEW XRAY OF THE LEFT SHOULDER 06/27/2024 06:17:08 PM COMPARISON: None available. CLINICAL HISTORY: Pain post seizure. FINDINGS: BONES AND JOINTS: Glenohumeral joint is normally aligned. No acute fracture or dislocation. Mild degenerative changes of the  acromioclavicular joint. SOFT TISSUES: No abnormal calcifications. Visualized lung is unremarkable. IMPRESSION: 1. No acute findings. 2. Mild degenerative changes of the acromioclavicular joint. Electronically signed by: Lonni Necessary MD 06/27/2024 06:33 PM EDT RP Workstation: [id number]   CT ANGIO HEAD NECK W WO CM Result Date: 06/27/2024 EXAM: CT HEAD WITHOUT CTA HEAD AND NECK WITH AND WITHOUT 06/27/2024 06:08:49 PM TECHNIQUE: CTA of the head and neck was performed with and without the administration of intravenous contrast. Noncontrast CT of the head with reconstructed 2-D images are also provided for review. Multiplanar 2D and/or 3D reformatted images are provided for review. Automated exposure control, iterative reconstruction, and/or weight based adjustment of the mA/kV was utilized to reduce the radiation dose to as low as reasonably achievable. COMPARISON: None available CLINICAL HISTORY: Transient ischemic attack (TIA). Seizure. FINDINGS: CT HEAD: BRAIN AND VENTRICLES: No acute intracranial hemorrhage. No mass effect or midline shift. No extra-axial fluid collection. Gray-white differentiation is maintained. No hydrocephalus. Periventricular white matter hypoattenuation is mildly advanced for age. ORBITS: No acute abnormality. SINUSES: No acute abnormality. SOFT TISSUES AND SKULL: No acute abnormality. CTA NECK: AORTIC ARCH AND ARCH VESSELS: No dissection or arterial injury. No significant stenosis of the brachiocephalic or subclavian arteries. Common origin of the left common carotid and dominant artery is noted. CERVICAL CAROTID ARTERIES: No dissection, arterial injury, or hemodynamically significant stenosis by NASCET criteria. CERVICAL VERTEBRAL ARTERIES: No dissection, arterial injury, or significant stenosis. The right vertebral artery is dominant. VISUALIZED LUNGS AND MEDIASTINUM: Unremarkable. SOFT TISSUES: No acute abnormality. BONES: Mild degenerative retrolisthesis is present at C5-6.  CTA HEAD: ANTERIOR CIRCULATION: No significant stenosis of the internal carotid arteries. No significant stenosis of the anterior cerebral arteries. No significant stenosis of the middle cerebral arteries. Mild segmental irregularity is present in the distal MCA branch vessels without a significant proximal stenosis or occlusion. No aneurysm. POSTERIOR CIRCULATION: No significant stenosis of the posterior cerebral arteries. No significant stenosis of the basilar artery. No significant stenosis of the vertebral arteries. Mild narrowing of distal PCA branch vessels is present bilaterally. No aneurysm. OTHER: No dural venous sinus thrombosis on this non-dedicated study. IMPRESSION: 1. No acute intracranial abnormality. 2. Mildly advanced periventricular white matter hypoattenuation for age. This most likely reflects the sequelae of chronic microvascular ischemia. 3. Mild segmental irregularity in the distal MCA branch vessels without significant proximal stenosis or occlusion. Mild narrowing of distal PCA branch vessels bilaterally. Findings suggest intracranial atherosclerosis 4. Normal CTA of the neck. 5. No significant proximal stenosis or occlusion within the Circle of Willis. Electronically signed by: Lonni Necessary MD 06/27/2024 06:33 PM EDT RP Workstation: [id number]   DG Chest Portable 1 View Result Date: 06/27/2024 CLINICAL DATA:  Seizure EXAM: PORTABLE CHEST 1 VIEW COMPARISON:  Chest x-ray 10/03/2011 FINDINGS: The heart size and mediastinal contours are within normal limits. Both lungs are clear. The visualized skeletal structures are unremarkable. IMPRESSION: No active disease. Electronically Signed   By: Greig Pique M.D.   On: 06/27/2024 18:27     Signature  -   Lavada Stank M.D on 06/28/2024 at 9:57 AM   -  To  page go to [email or web address]

## 2024-06-28 NOTE — Inpatient Diabetes Management (Signed)
 Inpatient Diabetes Program Recommendations  AACE/ADA: New Consensus Statement on Inpatient Glycemic Control (2015)  Target Ranges:  Prepandial:   less than 140 mg/dL      Peak postprandial:   less than 180 mg/dL (1-2 hours)      Critically ill patients:  140 - 180 mg/dL    Latest Reference Range & Units 06/27/24 16:30 06/28/24 00:09 06/28/24 07:53  Beta-Hydroxybutyric Acid 0.05 - 0.27 mmol/L 0.46 (H) 0.14 0.81 (H)  (H): Data is abnormally high  Latest Reference Range & Units 06/28/24 07:53  Hemoglobin A1C 4.8 - 5.6 % 12.2 (H)  303 mg/dl  (H): Data is abnormally high  Latest Reference Range & Units 06/27/24 16:28 06/27/24 19:00 06/27/24 19:45 06/27/24 21:07 06/27/24 22:50 06/28/24 00:22  Glucose-Capillary 70 - 99 mg/dL >399 (HH)  IV Insulin  Started 454 (H) 375 (H) 229 (H) 207 (H) 217 (H)    Latest Reference Range & Units 06/28/24 03:11 06/28/24 04:21 06/28/24 04:31 06/28/24 05:33 06/28/24 06:34 06/28/24 07:51  Glucose-Capillary 70 - 99 mg/dL 864 (H) 60 (L) 741 (H) 154 (H) 170 (H) 177 (H)    Latest Reference Range & Units 06/28/24 09:10 06/28/24 10:23 06/28/24 13:00  Glucose-Capillary 70 - 99 mg/dL 852 (H)  Lantus  18 units  IV Insulin  Drip Stopped 208 (H) 221 (H)  5 units Novolog    (H): Data is abnormally high    Admit with: DKA/ Seizures  History: Type 1 Diabetes  Home DM Meds: OmniPod 5 Insulin  Pump       Novolog  for the Insulin  Pump       Lantus  18-20 units daily (when off insulin  pump)       Dexcom G7 CGM  Current Orders: IV Insulin  Drip     Transitioned to Lantus  18 units daily + Novolog  Moderate Correction Scale/ SSI (0-15 units) TID AC + HS     ENDO: Dr. Faythe with [facility] Last Seen 05/11/2024 A1c was 13.1% 10/24/2023 At May 2025 appt: Carb Ratio lowered to 13  Per H&P notes:  Recent Poison Oak exposure Given Prednisone Has NOT Been using his insulin  pump for 2 months--not sure why?? Taking Lantus  18 units daily but only intermittently Has NOT been  monitoring CBGs   Transitioned to SQ Insulin  this AM Will plan to see pt Monday to assess why not using insulin  pump at home If he does not plan to resume his insulin  pump, he will need Novolog  Correction and Novolog  meal coverage for home in addition to the Lantus     --Will follow patient during hospitalization--  Adina Rudolpho Arrow RN, MSN, CDCES Diabetes Coordinator Inpatient Glycemic Control Team Team Pager: [phone] (8a-5p)

## 2024-06-28 NOTE — Progress Notes (Signed)
 EEG complete - results pending

## 2024-06-28 NOTE — TOC Initial Note (Signed)
 Transition of Care [facility]) - Initial/Assessment Note    Patient Details  Name: Francisco Werner MRN: [id number] Date of Birth: 10-26-1966  [facility] CM/SW Contact:    [name], RN Phone Number: 06/28/2024, 7:34 AM  Clinical Narrative:                  Per chartreview.  Patient admitted from hoem w wife. Admitted with hyperglycemia, seizure. Insulin  drip. Poor compliance with home insulin  regimen combined with acute use of steroids. Follows with endocrinology, has private insurance.  No anticipated needs at DC  Expected Discharge Plan: Home/Self Care Barriers to Discharge: Continued Medical Work up   Patient Goals and CMS Choice            Expected Discharge Plan and Services   Discharge Planning Services: CM Consult   Living arrangements for the past 2 months: Single Family Home                                      Prior Living Arrangements/Services Living arrangements for the past 2 months: Single Family Home Lives with:: Spouse                   Activities of Daily Living   ADL Screening (condition at time of admission) Independently performs ADLs?: Yes (appropriate for developmental age) Is the patient deaf or have difficulty hearing?: No Does the patient have difficulty seeing, even when wearing glasses/contacts?: No Does the patient have difficulty concentrating, remembering, or making decisions?: No  Permission Sought/Granted                  Emotional Assessment              Admission diagnosis:  Seizure (HCC) [R56.9] Hyperglycemia [R73.9] Patient Active Problem List   Diagnosis Date Noted   Seizure (HCC) 06/27/2024   Status epilepticus (HCC) 06/27/2024   DKA (diabetic ketoacidosis) (HCC) 06/27/2024   Hyperosmolar hyperglycemic state (HHS) (HCC) 06/27/2024   Lactic acidosis 06/27/2024   Left shoulder pain 06/27/2024   AC separation, type 3 08/17/2013   Instability of left shoulder joint 08/17/2013    Instability of right shoulder joint 08/17/2013   Scapular dyskinesis 08/17/2013   SLAP lesion of shoulder 08/17/2013   Tear of right glenoid labrum 08/17/2013   PCP:  Okey Carlin Redbird, MD Pharmacy:   [facility] [id number] [address] [place], [place] - [address] [place] [address] Phone: [phone] Fax: [phone]     Social Drivers of Health (SDOH) Social History: SDOH Screenings   Food Insecurity: No Food Insecurity (06/28/2024)  Housing: Low Risk  (06/28/2024)  Transportation Needs: No Transportation Needs (06/28/2024)  Utilities: Not At Risk (06/28/2024)  Tobacco Use: Low Risk  (06/27/2024)   SDOH Interventions:     Readmission Risk Interventions     No data to display

## 2024-06-28 NOTE — Evaluation (Signed)
 Physical Therapy Evaluation Patient Details Name: Francisco Werner MRN: [id number] DOB: [birth year] Today's Date: 06/28/2024  History of Present Illness  58 y.o. male who presented after first seizure. Per his report has not been using insulin  pump over the past 2 months, although has been taking Lantus  18 units intermittently just not on daily basis. Did take this in the morning. He also had recently been Rx'd prednisone for poison oak. Has not been monitoring sugars. He has never had diabetic crisis in the past.  Patient had an episode of seizure with urinary incontinence and tongue bite, was brought to the ER for further care was found to be in DKA; with hx of DM type 1 on insulin  pump, HTN, HLD, followed with [facility] for DM management, recent poison oak exposure and Rx for Prednisone outpatient,  Clinical Impression   Pt admitted with above diagnosis. Lives at home with wife, in a single-level home with a few steps to enter; Prior to admission, pt was independent; Presents to PT with L shoulder pain with any movement, but otherwise moving pretty well; Await read of pt's L shoulder MRI, and Ortho recs, but anticipate continuing follow up for L shoulder injury as outpt with Outpt PT follow up; Limited eval today, but overall standing well; Plan for a closer look at pt's walking, and then anticipate will sign off; Placed OT consult for a closer look at shoulder;  Pt currently with functional limitations due to the deficits listed below (see PT Problem List). Pt will benefit from skilled PT to increase their independence and safety with mobility to allow discharge to the venue listed below.           If plan is discharge home, recommend the following: Assistance with cooking/housework   Can travel by private vehicle        Equipment Recommendations None recommended by PT  Recommendations for Other Services  OT consult (ordered per protocol)    Functional Status Assessment Patient  has had a recent decline in their functional status and demonstrates the ability to make significant improvements in function in a reasonable and predictable amount of time.     Precautions / Restrictions Precautions Precautions: Other (comment) Precaution/Restrictions Comments: Await more info/Ortho consult for shoulder injury Restrictions Weight Bearing Restrictions Per Provider Order: No      Mobility  Bed Mobility Overal bed mobility: Modified Independent             General bed mobility comments: inr time, and got up on R side due to pain    Transfers Overall transfer level: Modified independent Equipment used: None               General transfer comment: No difficulty standing from EOB, held and suported LUE/shoulder with RUE    Ambulation/Gait               General Gait Details: Politely declined walking; wanting to take a nap  Stairs            Wheelchair Mobility     Tilt Bed    Modified Rankin (Stroke Patients Only)       Balance Overall balance assessment: Modified Independent                                           Pertinent Vitals/Pain Pain Assessment Pain Assessment: 0-10 Pain Score: 8  Pain Location: L shoulder after getting up and moving a bit Pain Descriptors / Indicators: Aching, Grimacing, Guarding Pain Intervention(s): Monitored during session, Repositioned, Patient requesting pain meds-RN notified    Home Living Family/patient expects to be discharged to:: Private residence Living Arrangements: Spouse/significant other Available Help at Discharge: Family Type of Home: House Home Access: Stairs to enter Entrance Stairs-Rails: None Secretary/administrator of Steps: 4   Home Layout: One level Home Equipment: None      Prior Function Prior Level of Function : Independent/Modified Independent                     Extremity/Trunk Assessment   Upper Extremity Assessment Upper  Extremity Assessment: LUE deficits/detail LUE Deficits / Details: L shoulder with pain moving in sagittal plane/flex and extension; Used RUE to assist LUE with shoulder flexion, did not tolerate much movement away from neutral (approx 10 degrees); more motion into extension LUE: Shoulder pain at rest;Shoulder pain with ROM    Lower Extremity Assessment Lower Extremity Assessment: Overall WFL for tasks assessed       Communication   Communication Communication: No apparent difficulties    Cognition Arousal: Alert Behavior During Therapy: WFL for tasks assessed/performed   PT - Cognitive impairments: No apparent impairments                         Following commands: Intact       Cueing Cueing Techniques: Verbal cues     General Comments General comments (skin integrity, edema, etc.): Opting to wait until L shoulder MRI is read to do much with L shoulder    Exercises     Assessment/Plan    PT Assessment Patient needs continued PT services  PT Problem List Decreased strength;Decreased range of motion;Decreased activity tolerance;Decreased balance;Decreased mobility;Decreased coordination;Decreased knowledge of use of DME;Decreased knowledge of precautions;Pain       PT Treatment Interventions DME instruction;Gait training;Stair training;Functional mobility training;Therapeutic activities;Therapeutic exercise;Balance training;Patient/family education;Manual techniques;Modalities    PT Goals (Current goals can be found in the Care Plan section)  Acute Rehab PT Goals Patient Stated Goal: get his L shoulder assessed PT Goal Formulation: With patient Time For Goal Achievement: 07/12/24 Potential to Achieve Goals: Good    Frequency Min 2X/week     Co-evaluation               AM-PAC PT 6 Clicks Mobility  Outcome Measure Help needed turning from your back to your side while in a flat bed without using bedrails?: None Help needed moving from lying on  your back to sitting on the side of a flat bed without using bedrails?: None Help needed moving to and from a bed to a chair (including a wheelchair)?: None Help needed standing up from a chair using your arms (e.g., wheelchair or bedside chair)?: None Help needed to walk in hospital room?: None Help needed climbing 3-5 steps with a railing? : A Little 6 Click Score: 23    End of Session   Activity Tolerance: Patient tolerated treatment well Patient left: in bed;with call bell/phone within reach;with family/visitor present Nurse Communication: Mobility status;Patient requests pain meds PT Visit Diagnosis: Other abnormalities of gait and mobility (R26.89);Pain Pain - Right/Left: Left Pain - part of body: Shoulder    Time: 8455-8397 PT Time Calculation (min) (ACUTE ONLY): 18 min   Charges:   PT Evaluation $PT Eval Low Complexity: 1 Low   PT General Charges $$ ACUTE PT  VISIT: 1 Visit         Silvano Currier, PT  Acute Rehabilitation Services Office [phone] Secure Chat welcomed   Silvano VEAR Currier 06/28/2024, 5:29 PM

## 2024-06-28 NOTE — Plan of Care (Signed)

## 2024-06-28 NOTE — Procedures (Signed)
 Patient Name: Francisco Werner  MRN: [id number]  Epilepsy Attending: Arlin MALVA Krebs  Referring Physician/Provider: Keturah Carrier, MD  Date: 06/28/2024 Duration: 23.43 mins  Patient history:  58 y.o. male with hx of DM type 1 on insulin  pump, HTN, HLD, followed with [facility] for DM management, recent poison oak exposure and Rx for Prednisone outpatient, who presented after first seizure. EEG to evaluate for seizure  Level of alertness: Awake, asleep  AEDs during EEG study: None  Technical aspects: This EEG study was done with scalp electrodes positioned according to the 10-20 International system of electrode placement. Electrical activity was reviewed with band pass filter of 1-70Hz , sensitivity of 7 uV/mm, display speed of 25mm/sec with a 60Hz  notched filter applied as appropriate. EEG data were recorded continuously and digitally stored.  Video monitoring was available and reviewed as appropriate.  Description: The posterior dominant rhythm consists of 8-9Hz  activity of moderate voltage (25-35 uV) seen predominantly in posterior head regions, symmetric and reactive to eye opening and eye closing. Sleep was characterized by vertex waves, sleep spindles (12 to 14 Hz), maximal frontocentral region. Hyperventilation and photic stimulation were not performed.     IMPRESSION: This study is within normal limits. No seizures or epileptiform discharges were seen throughout the recording.  A normal interictal EEG does not exclude the diagnosis of epilepsy.   [name] O Adalberto Metzgar

## 2024-06-28 NOTE — Progress Notes (Addendum)
 SLP Cancellation Note  Patient Details Name: PAVAN BRING MRN: [id number] DOB: 1966-05-14   Cancelled treatment:        Attempted to see pt for clinical swallowing evaluation.  Pt leaving floor to MRI at time of attempt.  RN reports very good tolerance of POs and will confirm if assessment is still desired.   Addendum: per nursing, formal clinical swallow evaluation is not needed at this time and this was confirmed with MD by RN.  SLP will sign off.    Anette FORBES Grippe  MA, CCC-SLP Acute Rehabilitation Services Office: [phone] 06/28/2024, 11:13 AM

## 2024-06-29 DIAGNOSIS — R569 Unspecified convulsions: Secondary | ICD-10-CM | POA: Diagnosis not present

## 2024-06-29 LAB — GLUCOSE, CAPILLARY
Glucose-Capillary: 219 mg/dL — ABNORMAL HIGH (ref 70–99)
Glucose-Capillary: 222 mg/dL — ABNORMAL HIGH (ref 70–99)
Glucose-Capillary: 187 mg/dL — ABNORMAL HIGH (ref 70–99)
Glucose-Capillary: 245 mg/dL — ABNORMAL HIGH (ref 70–99)

## 2024-06-29 LAB — CBC WITH DIFFERENTIAL/PLATELET
Abs Immature Granulocytes: 0.02 K/uL (ref 0.00–0.07)
Basophils Absolute: 0.1 K/uL (ref 0.0–0.1)
Basophils Relative: 1 %
Eosinophils Absolute: 0.2 K/uL (ref 0.0–0.5)
Eosinophils Relative: 2 %
HCT: 44.2 % (ref 39.0–52.0)
Hemoglobin: 14.5 g/dL (ref 13.0–17.0)
Immature Granulocytes: 0 %
Lymphocytes Relative: 26 %
Lymphs Abs: 1.7 K/uL (ref 0.7–4.0)
MCH: 28.4 pg (ref 26.0–34.0)
MCHC: 32.8 g/dL (ref 30.0–36.0)
MCV: 86.7 fL (ref 80.0–100.0)
Monocytes Absolute: 0.7 K/uL (ref 0.1–1.0)
Monocytes Relative: 11 %
Neutro Abs: 3.8 K/uL (ref 1.7–7.7)
Neutrophils Relative %: 60 %
Platelets: 241 K/uL (ref 150–400)
RBC: 5.1 MIL/uL (ref 4.22–5.81)
RDW: 12.4 % (ref 11.5–15.5)
WBC: 6.3 K/uL (ref 4.0–10.5)
nRBC: 0 % (ref 0.0–0.2)

## 2024-06-29 LAB — BASIC METABOLIC PANEL WITH GFR
Anion gap: 13 (ref 5–15)
BUN: 15 mg/dL (ref 6–20)
CO2: 23 mmol/L (ref 22–32)
Calcium: 9.2 mg/dL (ref 8.9–10.3)
Chloride: 102 mmol/L (ref 98–111)
Creatinine, Ser: 1.23 mg/dL (ref 0.61–1.24)
GFR, Estimated: >60 mL/min (ref >60)
Glucose, Bld: 224 mg/dL — ABNORMAL HIGH (ref 70–99)
Potassium: 4.1 mmol/L (ref 3.5–5.1)
Sodium: 138 mmol/L (ref 135–145)

## 2024-06-29 LAB — PHOSPHORUS: Phosphorus: 3.6 mg/dL (ref 2.5–4.6)

## 2024-06-29 LAB — MAGNESIUM: Magnesium: 1.7 mg/dL (ref 1.7–2.4)

## 2024-06-29 MED ORDER — MAGNESIUM SULFATE IN D5W 1-5 GM/100ML-% IV SOLN
1.0000 g | Freq: Once | INTRAVENOUS | Status: AC
  Administered 2024-06-29: 1 g via INTRAVENOUS
  Filled 2024-06-29: qty 100

## 2024-06-29 MED ORDER — KETOROLAC TROMETHAMINE 15 MG/ML IJ SOLN
15.0000 mg | Freq: Three times a day (TID) | INTRAMUSCULAR | Status: DC
  Administered 2024-06-29 – 2024-06-30 (×2): 15 mg via INTRAVENOUS
  Filled 2024-06-29 (×2): qty 1

## 2024-06-29 MED ORDER — INSULIN GLARGINE-YFGN 100 UNIT/ML ~~LOC~~ SOLN
25.0000 [IU] | Freq: Every day | SUBCUTANEOUS | Status: DC
  Administered 2024-06-30: 25 [IU] via SUBCUTANEOUS
  Filled 2024-06-29: qty 0.25

## 2024-06-29 NOTE — Plan of Care (Signed)

## 2024-06-29 NOTE — Inpatient Diabetes Management (Signed)
 Inpatient Diabetes Program Recommendations  AACE/ADA: New Consensus Statement on Inpatient Glycemic Control (2015)  Target Ranges:  Prepandial:   less than 140 mg/dL      Peak postprandial:   less than 180 mg/dL (1-2 hours)      Critically ill patients:  140 - 180 mg/dL   Lab Results  Component Value Date   GLUCAP 245 (H) 06/29/2024   HGBA1C 12.2 (H) 06/28/2024    Review of Glycemic Control  Diabetes history: DM1 Outpatient Diabetes medications: OmniPod with Dexcom 6 (has not used in over 2 months) Lantus  18 units daily, Novolog  4-5 units TID (not taking regularly) Current orders for Inpatient glycemic control: Semglee  25 daily, Novolog  0-15 TID with meals and 0-5 HS   HgbA1C - 12.2%  Inpatient Diabetes Program Recommendations:    Add Novolog  4 units TID with meals if eating > 50% (not needed if pt starts insulin  pump)  Please order Insulin  pump order set for pt to restart his OmniPod and will give sample Dexcom G7 CGM to pair with pump. (Wife to bring pump 9/2 in am)  Spoke with pt and wife at bedside regarding his diabetes and HgbA1C of 12.2%. Pt states his pharmacy was out of the Dexcom 6 and he just decided to stop his pump since he ran out of his CGMs. Pt states he was just tired of pump and everything associated with his diabetes. Understands now it was a bad decision to stop his pump as pt knows he needs insulin  to live. Wife would like to bring insulin  pump tomorrow morning and have pt download the Dexcom G7 to use with his OmniPod.  Discussed importance of controlling blood sugars to prevent both long and short-term complications. Discussed diabetes and HgbA1C goals. Endo is Dr Faythe.  Pt's wife is very supportive and trying to cook healthy, limiting pasta dishes. Discussed impact of nutrition, exercise, stress, sickness, and medications on diabetes control.  F/U in am.   Thank you. Shona Brandy, RD, LDN, CDCES Inpatient Diabetes Coordinator [phone]

## 2024-06-29 NOTE — Consult Note (Signed)
 Orthopaedic Trauma Service (OTS) Consult   Patient ID: Francisco Werner MRN: [id number] DOB/AGE: August 13, 1966 58 y.o.   Reason for Consult: Left shoulder rotator cuff tear Referring Physician: Lavada Stank, MD (internal medicine)   HPI: Francisco Werner is an 58 y.o. LHD black male who was admitted 06/27/2024 after seizure activity.  Patient found to be in status epilepticus due to metabolic derangements from DKA  Ultimately began to complain of left shoulder pain.  Initially x-ray was obtained which did not demonstrate any acute injuries to the left shoulder.  MRI was then obtained which did demonstrate rotator cuff tear.  Orthopedics consulted for further evaluation.  Patient seen at bedside  Does report a previous history of left pec tear which was treated nonoperatively.  He is quite active.  He is a retired Emergency planning/management officer.  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 No pertinent surgical history.  Family History  Problem Relation Age of Onset   Diabetes Mother    Cancer Mother        breast   Cancer Father        appendix   Diabetes Father    Hypertension Father     Social History:  reports that he has never smoked. He has never used smokeless tobacco. He reports current alcohol use. He reports that he does not use drugs.  Allergies:  Allergies  Allergen Reactions   Naproxen Other (See Comments)    GI bleed   Shellfish-Derived Products Swelling    Medications: I have reviewed the patient's current medications. Current Outpatient Medications  Medication Instructions   atorvastatin  (LIPITOR) 10 mg, Daily   hydrOXYzine (ATARAX) 25 mg, Oral, 3  times daily, 7 day therapy for poison ivy   Insulin  Disposable Pump (OMNIPOD 5 [id number] INTRO GEN 5) KIT 1 each, See admin instructions, Every 3 days   Lantus  SoloStar 18-20 Units, Subcutaneous, Daily   losartan  (COZAAR ) 100 mg, Oral, Daily   NovoLOG  50 Units, Subcutaneous, Every 24 hours, For use with Omnipod, up to 50 units/day   OVER THE COUNTER MEDICATION 1 Dose, Oral, Daily, Black seed oil (Nigella sativa)   predniSONE (DELTASONE) 20-60 mg, Oral, See admin instructions, TAKE 3 tabs once daily on days 1-5, THEN 2 tabs once daily on days 6-10, THEN 1 tab once daily on days 11 AND 12     Results for orders placed or performed during the hospital encounter of 06/27/24 (from the past 48 hours)  Basic metabolic panel - if new onset seizures     Status: Abnormal   Collection Time: 06/27/24  4:20 PM  Result Value Ref Range   Sodium 128 (L) 135 - 145 mmol/L   Potassium 5.3 (H) 3.5 - 5.1 mmol/L   Chloride 94 (L) 98 - 111 mmol/L   CO2 16 (L) 22 - 32 mmol/L   Glucose, Bld 700 (HH) 70 - 99 mg/dL    Comment: CRITICAL RESULT CALLED TO, READ BACK BY AND VERIFIED WITH YANG CHLOE RN @1727  06/27/2024 ONKWARE.P Glucose reference range applies only to samples taken after fasting for at least 8 hours.  BUN 25 (H) 6 - 20 mg/dL   Creatinine, Ser 8.44 (H) 0.61 - 1.24 mg/dL   Calcium  9.6 8.9 - 10.3 mg/dL   GFR, Estimated 52 (L) >60 mL/min    Comment: (NOTE) Calculated using the CKD-EPI Creatinine Equation (2021)    Anion gap 18 (H) 5 - 15    Comment: Performed at [facility], [address], [place], [place] [address]  CBC - if new onset seizures     Status: None   Collection Time: 06/27/24  4:20 PM  Result Value Ref Range   WBC 5.3 4.0 - 10.5 K/uL   RBC 5.15 4.22 - 5.81 MIL/uL   Hemoglobin 14.7 13.0 - 17.0 g/dL   HCT 55.8 60.9 - 47.9 %   MCV 85.6 80.0 - 100.0 fL   MCH 28.5 26.0 - 34.0 pg   MCHC 33.3 30.0 - 36.0 g/dL   RDW 88.0 88.4 - 84.4 %   Platelets 262 150 - 400 K/uL   nRBC 0.0 0.0  - 0.2 %    Comment: Performed at [facility], [address], [place], [place] [address]  Troponin I (High Sensitivity)     Status: None   Collection Time: 06/27/24  4:20 PM  Result Value Ref Range   Troponin I (High Sensitivity) 7 <18 ng/L    Comment: (NOTE) Elevated high sensitivity troponin I (hsTnI) values and significant  changes across serial measurements may suggest ACS but many other  chronic and acute conditions are known to elevate hsTnI results.  Refer to the Links section for chest pain algorithms and additional  guidance. Performed at [facility], [address], [place], [place] [address]   Urine rapid drug screen (hosp performed)     Status: None   Collection Time: 06/27/24  4:22 PM  Result Value Ref Range   Opiates NONE DETECTED NONE DETECTED   Cocaine NONE DETECTED NONE DETECTED   Benzodiazepines NONE DETECTED NONE DETECTED   Amphetamines NONE DETECTED NONE DETECTED   Tetrahydrocannabinol NONE DETECTED NONE DETECTED   Barbiturates NONE DETECTED NONE DETECTED    Comment: (NOTE) DRUG SCREEN FOR MEDICAL PURPOSES ONLY.  IF CONFIRMATION IS NEEDED FOR ANY PURPOSE, NOTIFY LAB WITHIN 5 DAYS.  LOWEST DETECTABLE LIMITS FOR URINE DRUG SCREEN Drug Class                     Cutoff (ng/mL) Amphetamine and metabolites    1000 Barbiturate and metabolites    200 Benzodiazepine                 200 Opiates and metabolites        300 Cocaine and metabolites        300 THC                            50 Performed at [facility], [address], [place], [place] [address]   Urinalysis, w/ Reflex to Culture (Infection Suspected) -Urine, Clean Catch     Status: Abnormal   Collection Time: 06/27/24  4:22 PM  Result Value Ref Range   Specimen Source URINE, CLEAN CATCH    Color, Urine STRAW (A) YELLOW   APPearance CLEAR CLEAR   Specific Gravity, Urine 1.026 1.005 - 1.030   pH 5.0 5.0 - 8.0   Glucose, UA >=500 (A) NEGATIVE mg/dL   Hgb urine dipstick  NEGATIVE NEGATIVE   Bilirubin Urine  NEGATIVE NEGATIVE   Ketones, ur 5 (A) NEGATIVE mg/dL   Protein, ur NEGATIVE NEGATIVE mg/dL   Nitrite NEGATIVE NEGATIVE   Leukocytes,Ua NEGATIVE NEGATIVE   RBC / HPF 0-5 0 - 5 RBC/hpf   WBC, UA 0-5 0 - 5 WBC/hpf    Comment:        Reflex urine culture not performed if WBC <=10, OR if Squamous epithelial cells >5. If Squamous epithelial cells >5 suggest recollection.    Bacteria, UA NONE SEEN NONE SEEN   Squamous Epithelial / HPF 0-5 0 - 5 /HPF    Comment: Performed at [facility], [address], [place], [place] [address]  CBG monitoring, ED     Status: Abnormal   Collection Time: 06/27/24  4:28 PM  Result Value Ref Range   Glucose-Capillary >600 (HH) 70 - 99 mg/dL    Comment: Glucose reference range applies only to samples taken after fasting for at least 8 hours.   Comment 1 Notify RN    Comment 2 Document in Chart   Beta-hydroxybutyric acid     Status: Abnormal   Collection Time: 06/27/24  4:30 PM  Result Value Ref Range   Beta-Hydroxybutyric Acid 0.46 (H) 0.05 - 0.27 mmol/L    Comment: Performed at [facility], [address] [place], [place] [address]  I-stat chem 8, ED (not at [facility], [facility] or [facility])     Status: Abnormal   Collection Time: 06/27/24  4:39 PM  Result Value Ref Range   Sodium 128 (L) 135 - 145 mmol/L   Potassium 5.4 (H) 3.5 - 5.1 mmol/L   Chloride 98 98 - 111 mmol/L   BUN 28 (H) 6 - 20 mg/dL   Creatinine, Ser 8.69 (H) 0.61 - 1.24 mg/dL   Glucose, Bld 314 (HH) 70 - 99 mg/dL    Comment: Glucose reference range applies only to samples taken after fasting for at least 8 hours.   Calcium , Ion 1.10 (L) 1.15 - 1.40 mmol/L   TCO2 18 (L) 22 - 32 mmol/L   Hemoglobin 16.3 13.0 - 17.0 g/dL   HCT 51.9 60.9 - 47.9 %   Comment NOTIFIED PHYSICIAN   I-Stat CG4 Lactic Acid     Status: Abnormal   Collection Time: 06/27/24  4:39 PM  Result Value Ref Range   Lactic Acid, Venous 7.5 (HH) 0.5 - 1.9 mmol/L  Troponin I (High  Sensitivity)     Status: None   Collection Time: 06/27/24  6:28 PM  Result Value Ref Range   Troponin I (High Sensitivity) 8 <18 ng/L    Comment: (NOTE) Elevated high sensitivity troponin I (hsTnI) values and significant  changes across serial measurements may suggest ACS but many other  chronic and acute conditions are known to elevate hsTnI results.  Refer to the Links section for chest pain algorithms and additional  guidance. Performed at [facility], [address], [place], [place] [address]   CBG monitoring, ED     Status: Abnormal   Collection Time: 06/27/24  7:00 PM  Result Value Ref Range   Glucose-Capillary 454 (H) 70 - 99 mg/dL    Comment: Glucose reference range applies only to samples taken after fasting for at least 8 hours.  CBG monitoring, ED     Status: Abnormal   Collection Time: 06/27/24  7:45 PM  Result Value Ref Range   Glucose-Capillary 375 (H) 70 - 99 mg/dL    Comment: Glucose reference range applies only  to samples taken after fasting for at least 8 hours.  Osmolality     Status: Abnormal   Collection Time: 06/27/24  7:45 PM  Result Value Ref Range   Osmolality 307 (H) 275 - 295 mOsm/kg    Comment: Performed at [facility], [address], [place], [place] [address]  Basic metabolic panel with GFR     Status: Abnormal   Collection Time: 06/27/24  7:45 PM  Result Value Ref Range   Sodium 131 (L) 135 - 145 mmol/L   Potassium 4.4 3.5 - 5.1 mmol/L   Chloride 99 98 - 111 mmol/L   CO2 20 (L) 22 - 32 mmol/L   Glucose, Bld 371 (H) 70 - 99 mg/dL    Comment: Glucose reference range applies only to samples taken after fasting for at least 8 hours.   BUN 23 (H) 6 - 20 mg/dL   Creatinine, Ser 8.76 0.61 - 1.24 mg/dL   Calcium  9.2 8.9 - 10.3 mg/dL   GFR, Estimated >39 >39 mL/min    Comment: (NOTE) Calculated using the CKD-EPI Creatinine Equation (2021)    Anion gap 12 5 - 15    Comment: Performed at [facility], [address]  [place], [place] [address]  I-Stat CG4 Lactic Acid     Status: Abnormal   Collection Time: 06/27/24  7:53 PM  Result Value Ref Range   Lactic Acid, Venous 2.5 (HH) 0.5 - 1.9 mmol/L   Comment NOTIFIED PHYSICIAN   I-Stat venous blood gas, ([facility] ED, [facility], [facility])     Status: Abnormal   Collection Time: 06/27/24  7:55 PM  Result Value Ref Range   pH, Ven 7.465 (H) 7.25 - 7.43   pCO2, Ven 30.1 (L) 44 - 60 mmHg   pO2, Ven 92 (H) 32 - 45 mmHg   Bicarbonate 21.7 20.0 - 28.0 mmol/L   TCO2 23 22 - 32 mmol/L   O2 Saturation 98 %   Acid-base deficit 1.0 0.0 - 2.0 mmol/L   Sodium 132 (L) 135 - 145 mmol/L   Potassium 4.4 3.5 - 5.1 mmol/L   Calcium , Ion 1.13 (L) 1.15 - 1.40 mmol/L   HCT 42.0 39.0 - 52.0 %   Hemoglobin 14.3 13.0 - 17.0 g/dL   Sample type VENOUS   CBG monitoring, ED     Status: Abnormal   Collection Time: 06/27/24  9:07 PM  Result Value Ref Range   Glucose-Capillary 229 (H) 70 - 99 mg/dL    Comment: Glucose reference range applies only to samples taken after fasting for at least 8 hours.  Glucose, capillary     Status: Abnormal   Collection Time: 06/27/24 10:50 PM  Result Value Ref Range   Glucose-Capillary 207 (H) 70 - 99 mg/dL    Comment: Glucose reference range applies only to samples taken after fasting for at least 8 hours.  Blood gas, venous     Status: Abnormal   Collection Time: 06/27/24 11:58 PM  Result Value Ref Range   pH, Ven 7.43 7.25 - 7.43   pCO2, Ven 38 (L) 44 - 60 mmHg   pO2, Ven 58 (H) 32 - 45 mmHg   Bicarbonate 25.2 20.0 - 28.0 mmol/L   Acid-Base Excess 1.0 0.0 - 2.0 mmol/L   O2 Saturation 91.6 %   Patient temperature 37.0     Comment: Performed at [facility], [address] [place], [place] [address]  Basic metabolic panel     Status:  Abnormal   Collection Time: 06/28/24 12:09 AM  Result Value Ref Range   Sodium 135 135 - 145 mmol/L   Potassium 3.9 3.5 - 5.1 mmol/L   Chloride 102 98 - 111 mmol/L   CO2 21 (L) 22 - 32 mmol/L   Glucose, Bld 209 (H) 70 -  99 mg/dL    Comment: Glucose reference range applies only to samples taken after fasting for at least 8 hours.   BUN 16 6 - 20 mg/dL   Creatinine, Ser 8.95 0.61 - 1.24 mg/dL   Calcium  9.1 8.9 - 10.3 mg/dL   GFR, Estimated >39 >39 mL/min    Comment: (NOTE) Calculated using the CKD-EPI Creatinine Equation (2021)    Anion gap 12 5 - 15    Comment: Performed at [facility], [address], [place], [place] [address]  Beta-hydroxybutyric acid     Status: None   Collection Time: 06/28/24 12:09 AM  Result Value Ref Range   Beta-Hydroxybutyric Acid 0.14 0.05 - 0.27 mmol/L    Comment: Performed at [facility], [address], [place], [place] [address]  Glucose, capillary     Status: Abnormal   Collection Time: 06/28/24 12:22 AM  Result Value Ref Range   Glucose-Capillary 217 (H) 70 - 99 mg/dL    Comment: Glucose reference range applies only to samples taken after fasting for at least 8 hours.  Glucose, capillary     Status: Abnormal   Collection Time: 06/28/24  3:11 AM  Result Value Ref Range   Glucose-Capillary 135 (H) 70 - 99 mg/dL    Comment: Glucose reference range applies only to samples taken after fasting for at least 8 hours.  Glucose, capillary     Status: Abnormal   Collection Time: 06/28/24  4:21 AM  Result Value Ref Range   Glucose-Capillary 60 (L) 70 - 99 mg/dL    Comment: Glucose reference range applies only to samples taken after fasting for at least 8 hours.  Glucose, capillary     Status: Abnormal   Collection Time: 06/28/24  4:31 AM  Result Value Ref Range   Glucose-Capillary 258 (H) 70 - 99 mg/dL    Comment: Glucose reference range applies only to samples taken after fasting for at least 8 hours.  Glucose, capillary     Status: Abnormal   Collection Time: 06/28/24  5:33 AM  Result Value Ref Range   Glucose-Capillary 154 (H) 70 - 99 mg/dL    Comment: Glucose reference range applies only to samples taken after fasting for at least 8 hours.  Glucose,  capillary     Status: Abnormal   Collection Time: 06/28/24  6:34 AM  Result Value Ref Range   Glucose-Capillary 170 (H) 70 - 99 mg/dL    Comment: Glucose reference range applies only to samples taken after fasting for at least 8 hours.  Glucose, capillary     Status: Abnormal   Collection Time: 06/28/24  7:51 AM  Result Value Ref Range   Glucose-Capillary 177 (H) 70 - 99 mg/dL    Comment: Glucose reference range applies only to samples taken after fasting for at least 8 hours.  Lactic acid, plasma     Status: None   Collection Time: 06/28/24  7:53 AM  Result Value Ref Range   Lactic Acid, Venous 1.1 0.5 - 1.9 mmol/L    Comment: Performed at [facility], [address], [place], [place] [address]  Osmolality     Status:  None   Collection Time: 06/28/24  7:53 AM  Result Value Ref Range   Osmolality 294 275 - 295 mOsm/kg    Comment: Performed at [facility], [address] [place], [place] [address]  TSH     Status: None   Collection Time: 06/28/24  7:53 AM  Result Value Ref Range   TSH 0.401 0.350 - 4.500 uIU/mL    Comment: Performed by a 3rd Generation assay with a functional sensitivity of <=0.01 uIU/mL. Performed at [facility], [address], [place], [place] [address]   Hemoglobin A1c     Status: Abnormal   Collection Time: 06/28/24  7:53 AM  Result Value Ref Range   Hgb A1c MFr Bld 12.2 (H) 4.8 - 5.6 %    Comment: (NOTE) Diagnosis of Diabetes The following HbA1c ranges recommended by the American Diabetes Association (ADA) may be used as an aid in the diagnosis of diabetes mellitus.  Hemoglobin             Suggested A1C NGSP%              Diagnosis  <5.7                   Non Diabetic  5.7-6.4                Pre-Diabetic  >6.4                   Diabetic  <7.0                   Glycemic control for                       adults with diabetes.     Mean Plasma Glucose 303.44 mg/dL    Comment: Performed at [facility], [address] [place], [place] [address]  Beta-hydroxybutyric acid     Status: Abnormal   Collection Time: 06/28/24  7:53 AM  Result Value Ref Range   Beta-Hydroxybutyric Acid 0.81 (H) 0.05 - 0.27 mmol/L    Comment: Performed at [facility], [address], [place], [place] [address]  HIV Antibody (routine testing w rflx)     Status: None   Collection Time: 06/28/24  7:53 AM  Result Value Ref Range   HIV Screen 4th Generation wRfx Non Reactive Non Reactive    Comment: Performed at [facility], [address], [place], [place] [address]  Phosphorus     Status: None   Collection Time: 06/28/24  7:53 AM  Result Value Ref Range   Phosphorus 3.9 2.5 - 4.6 mg/dL    Comment: Performed at [facility], [address], [place], [place] [address]  CBC with Differential/Platelet     Status: Abnormal   Collection Time: 06/28/24  7:53 AM  Result Value Ref Range   WBC 11.8 (H) 4.0 - 10.5 K/uL   RBC 4.59 4.22 - 5.81 MIL/uL   Hemoglobin 13.0 13.0 - 17.0 g/dL   HCT 61.2 (L) 60.9 - 47.9 %   MCV 84.3 80.0 - 100.0 fL   MCH 28.3 26.0 - 34.0 pg   MCHC 33.6 30.0 - 36.0 g/dL   RDW 87.8 88.4 - 84.4 %   Platelets 224 150 - 400 K/uL   nRBC 0.0 0.0 - 0.2 %   Neutrophils Relative % 76 %   Neutro Abs 9.0 (H) 1.7 -  7.7 K/uL   Lymphocytes Relative 13 %   Lymphs Abs 1.5 0.7 - 4.0 K/uL   Monocytes Relative 10 %   Monocytes Absolute 1.2 (H) 0.1 - 1.0 K/uL   Eosinophils Relative 0 %   Eosinophils Absolute 0.0 0.0 - 0.5 K/uL   Basophils Relative 0 %   Basophils Absolute 0.0 0.0 - 0.1 K/uL   Immature Granulocytes 1 %   Abs Immature Granulocytes 0.08 (H) 0.00 - 0.07 K/uL    Comment: Performed at [facility], [address], [place], [place] [address]  Comprehensive metabolic panel with GFR     Status: Abnormal   Collection Time: 06/28/24  7:53 AM  Result Value Ref Range   Sodium 135 135 - 145 mmol/L   Potassium 3.8 3.5 - 5.1 mmol/L   Chloride 102 98 - 111 mmol/L   CO2 24 22 - 32 mmol/L    Glucose, Bld 177 (H) 70 - 99 mg/dL    Comment: Glucose reference range applies only to samples taken after fasting for at least 8 hours.   BUN 12 6 - 20 mg/dL   Creatinine, Ser 8.91 0.61 - 1.24 mg/dL   Calcium  8.7 (L) 8.9 - 10.3 mg/dL   Total Protein 6.2 (L) 6.5 - 8.1 g/dL   Albumin 3.4 (L) 3.5 - 5.0 g/dL   AST 21 15 - 41 U/L   ALT 22 0 - 44 U/L   Alkaline Phosphatase 49 38 - 126 U/L   Total Bilirubin 1.0 0.0 - 1.2 mg/dL   GFR, Estimated >39 >39 mL/min    Comment: (NOTE) Calculated using the CKD-EPI Creatinine Equation (2021)    Anion gap 9 5 - 15    Comment: Performed at [facility], [address], [place], [place] [address]  Magnesium      Status: None   Collection Time: 06/28/24  7:53 AM  Result Value Ref Range   Magnesium  1.9 1.7 - 2.4 mg/dL    Comment: Performed at [facility], [address], [place], [place] [address]  Ammonia     Status: Abnormal   Collection Time: 06/28/24  7:53 AM  Result Value Ref Range   Ammonia 36 (H) 9 - 35 umol/L    Comment: HEMOLYSIS AT THIS LEVEL MAY AFFECT RESULT Performed at [facility], [address], [place], [place] [address]   Glucose, capillary     Status: Abnormal   Collection Time: 06/28/24  9:10 AM  Result Value Ref Range   Glucose-Capillary 147 (H) 70 - 99 mg/dL    Comment: Glucose reference range applies only to samples taken after fasting for at least 8 hours.  Glucose, capillary     Status: Abnormal   Collection Time: 06/28/24 10:23 AM  Result Value Ref Range   Glucose-Capillary 208 (H) 70 - 99 mg/dL    Comment: Glucose reference range applies only to samples taken after fasting for at least 8 hours.  Lipid panel     Status: Abnormal   Collection Time: 06/28/24 10:30 AM  Result Value Ref Range   Cholesterol 210 (H) 0 - 200 mg/dL   Triglycerides 90 <849 mg/dL   HDL 78 >59 mg/dL   Total CHOL/HDL Ratio 2.7 RATIO   VLDL 18 0 - 40 mg/dL   LDL Cholesterol 885 (H) 0 - 99 mg/dL    Comment:        Total  Cholesterol/HDL:CHD Risk Coronary Heart Disease Risk Table  Men   Women  1/2 Average Risk   3.4   3.3  Average Risk       5.0   4.4  2 X Average Risk   9.6   7.1  3 X Average Risk  23.4   11.0        Use the calculated Patient Ratio above and the CHD Risk Table to determine the patient's CHD Risk.        ATP III CLASSIFICATION (LDL):  <100     mg/dL   Optimal  899-870  mg/dL   Near or Above                    Optimal  130-159  mg/dL   Borderline  839-810  mg/dL   High  >809     mg/dL   Very High Performed at [facility], [address] [place], [place] [address]   Glucose, capillary     Status: Abnormal   Collection Time: 06/28/24  1:00 PM  Result Value Ref Range   Glucose-Capillary 221 (H) 70 - 99 mg/dL    Comment: Glucose reference range applies only to samples taken after fasting for at least 8 hours.  Glucose, capillary     Status: Abnormal   Collection Time: 06/28/24  5:01 PM  Result Value Ref Range   Glucose-Capillary 296 (H) 70 - 99 mg/dL    Comment: Glucose reference range applies only to samples taken after fasting for at least 8 hours.  Glucose, capillary     Status: Abnormal   Collection Time: 06/28/24  9:22 PM  Result Value Ref Range   Glucose-Capillary 234 (H) 70 - 99 mg/dL    Comment: Glucose reference range applies only to samples taken after fasting for at least 8 hours.  CBC with Differential/Platelet     Status: None   Collection Time: 06/29/24  6:12 AM  Result Value Ref Range   WBC 6.3 4.0 - 10.5 K/uL   RBC 5.10 4.22 - 5.81 MIL/uL   Hemoglobin 14.5 13.0 - 17.0 g/dL   HCT 55.7 60.9 - 47.9 %   MCV 86.7 80.0 - 100.0 fL   MCH 28.4 26.0 - 34.0 pg   MCHC 32.8 30.0 - 36.0 g/dL   RDW 87.5 88.4 - 84.4 %   Platelets 241 150 - 400 K/uL   nRBC 0.0 0.0 - 0.2 %   Neutrophils Relative % 60 %   Neutro Abs 3.8 1.7 - 7.7 K/uL   Lymphocytes Relative 26 %   Lymphs Abs 1.7 0.7 - 4.0 K/uL   Monocytes Relative 11 %   Monocytes Absolute 0.7  0.1 - 1.0 K/uL   Eosinophils Relative 2 %   Eosinophils Absolute 0.2 0.0 - 0.5 K/uL   Basophils Relative 1 %   Basophils Absolute 0.1 0.0 - 0.1 K/uL   Immature Granulocytes 0 %   Abs Immature Granulocytes 0.02 0.00 - 0.07 K/uL    Comment: Performed at [facility], [address] [place], [place] [address]  Basic metabolic panel with GFR     Status: Abnormal   Collection Time: 06/29/24  6:12 AM  Result Value Ref Range   Sodium 138 135 - 145 mmol/L   Potassium 4.1 3.5 - 5.1 mmol/L   Chloride 102 98 - 111 mmol/L   CO2 23 22 - 32 mmol/L   Glucose, Bld 224 (H) 70 - 99 mg/dL    Comment: Glucose reference range applies only  to samples taken after fasting for at least 8 hours.   BUN 15 6 - 20 mg/dL   Creatinine, Ser 8.76 0.61 - 1.24 mg/dL   Calcium  9.2 8.9 - 10.3 mg/dL   GFR, Estimated >39 >39 mL/min    Comment: (NOTE) Calculated using the CKD-EPI Creatinine Equation (2021)    Anion gap 13 5 - 15    Comment: Performed at [facility], [address], [place], [place] [address]  Magnesium      Status: None   Collection Time: 06/29/24  6:12 AM  Result Value Ref Range   Magnesium  1.7 1.7 - 2.4 mg/dL    Comment: Performed at [facility], [address] [place], [place] [address]  Phosphorus     Status: None   Collection Time: 06/29/24  6:12 AM  Result Value Ref Range   Phosphorus 3.6 2.5 - 4.6 mg/dL    Comment: Performed at [facility], [address], [place], [place] [address]  Glucose, capillary     Status: Abnormal   Collection Time: 06/29/24  7:36 AM  Result Value Ref Range   Glucose-Capillary 219 (H) 70 - 99 mg/dL    Comment: Glucose reference range applies only to samples taken after fasting for at least 8 hours.  Glucose, capillary     Status: Abnormal   Collection Time: 06/29/24 11:07 AM  Result Value Ref Range   Glucose-Capillary 222 (H) 70 - 99 mg/dL    Comment: Glucose reference range applies only to samples taken after fasting for at least 8  hours.    MR SHOULDER LEFT WO CONTRAST Result Date: 06/29/2024 CLINICAL DATA:  Left shoulder pain status post fall EXAM: MRI OF THE LEFT SHOULDER WITHOUT CONTRAST TECHNIQUE: Multiplanar, multisequence MR imaging of the shoulder was performed. No intravenous contrast was administered. COMPARISON:  None Available. FINDINGS: Rotator cuff: Mild supraspinatus tendinosis with a small partial-thickness bursal surface tear towards the posterior insertion. Large full-thickness, near complete, tear of the infraspinatus tendon with 3 cm of retraction. Teres minor tendon is intact. Subscapularis tendon is intact. Muscles: Severe infraspinatus muscle strain with a partial thickness tear along the posterior aspect near the musculotendinous junction. Mild supraspinatus muscle strain along the posterior margin. No muscle atrophy. Biceps Long Head: Moderate tendinosis of the intra-articular portion of the long head of the biceps tendon. Acromioclavicular Joint: Mild-moderate arthropathy of the acromioclavicular joint. Small amount of subacromial/subdeltoid bursal fluid. Glenohumeral Joint: Small joint effusion.  No chondral defect. Labrum: Grossly intact, but evaluation is limited by lack of intraarticular fluid/contrast. Bones: No fracture or dislocation. No aggressive osseous lesion. Other: No fluid collection or hematoma. IMPRESSION: 1. Large full-thickness, near complete, tear of the infraspinatus tendon with 3 cm of retraction. Severe infraspinatus muscle strain with a partial thickness tear along the posterior aspect near the musculotendinous junction. 2. Mild supraspinatus tendinosis with a small partial-thickness bursal surface tear towards the posterior insertion. 3. Moderate tendinosis of the intra-articular portion of the long head of the biceps tendon. Electronically Signed   By: Julaine Blanch M.D.   On: 06/29/2024 07:26   EEG adult Result Date: 06/28/2024 Shelton Arlin KIDD, MD     06/28/2024  7:06 AM Patient Name:  CALEY VOLKERT MRN: [id number] Epilepsy Attending: Arlin KIDD Shelton Referring Physician/Provider: Keturah Carrier, MD Date: 06/28/2024 Duration: 23.43 mins Patient history:  58 y.o. male with hx of DM type 1 on insulin  pump, HTN, HLD, followed with [facility] for DM management, recent poison  oak exposure and Rx for Prednisone outpatient, who presented after first seizure. EEG to evaluate for seizure Level of alertness: Awake, asleep AEDs during EEG study: None Technical aspects: This EEG study was done with scalp electrodes positioned according to the 10-20 International system of electrode placement. Electrical activity was reviewed with band pass filter of 1-70Hz , sensitivity of 7 uV/mm, display speed of 34mm/sec with a 60Hz  notched filter applied as appropriate. EEG data were recorded continuously and digitally stored.  Video monitoring was available and reviewed as appropriate. Description: The posterior dominant rhythm consists of 8-9Hz  activity of moderate voltage (25-35 uV) seen predominantly in posterior head regions, symmetric and reactive to eye opening and eye closing. Sleep was characterized by vertex waves, sleep spindles (12 to 14 Hz), maximal frontocentral region. Hyperventilation and photic stimulation were not performed.   IMPRESSION: This study is within normal limits. No seizures or epileptiform discharges were seen throughout the recording. A normal interictal EEG does not exclude the diagnosis of epilepsy. Arlin MALVA Krebs   MR BRAIN WO CONTRAST Result Date: 06/28/2024 CLINICAL DATA:  Initial evaluation for new onset seizure. EXAM: MRI HEAD WITHOUT CONTRAST TECHNIQUE: Multiplanar, multiecho pulse sequences of the brain and surrounding structures were obtained without intravenous contrast. COMPARISON:  Prior CT from 06/27/2024 FINDINGS: Brain: Mild diffuse prominence of the CSF and disease compatible generalized cerebral atrophy. Scattered patchy T2/FLAIR hyperintensity involving the  periventricular deep white matter both cerebral hemispheres, most characteristic of chronic microvascular ischemic disease, mild-to-moderate in nature. FLAIR signal intensity involving the periventricular white matter adjacent to the temporal horns of both lateral ventricles noted, likely small vessel related. No abnormal foci of restricted diffusion to suggest acute or subacute ischemia. No signal changes to suggest postictal changes. Gray-white matter differentiation maintained. No areas of chronic cortical infarction. No acute or chronic intracranial blood products. No mass lesion, midline shift or mass effect. No hydrocephalus or extra-axial fluid collection. Pituitary gland and suprasellar region within normal limits. No intrinsic temporal lobe abnormality. Vascular: Major intracranial vascular flow voids are maintained. Skull and upper cervical spine: Craniocervical junction within normal limits. Bone marrow signal intensity overall within normal limits. No scalp soft tissue abnormality. Sinuses/Orbits: Globes and orbital soft tissues within normal limits. Paranasal sinuses are largely clear. No mastoid effusion. Other: None. IMPRESSION: 1. No acute intracranial abnormality. 2. Mild to moderate cerebral white matter disease, most characteristic of chronic microvascular ischemic disease. Electronically Signed   By: Morene Hoard M.D.   On: 06/28/2024 02:49   DG Shoulder Left Result Date: 06/27/2024 EXAM: 1 VIEW XRAY OF THE LEFT SHOULDER 06/27/2024 06:17:08 PM COMPARISON: None available. CLINICAL HISTORY: Pain post seizure. FINDINGS: BONES AND JOINTS: Glenohumeral joint is normally aligned. No acute fracture or dislocation. Mild degenerative changes of the acromioclavicular joint. SOFT TISSUES: No abnormal calcifications. Visualized lung is unremarkable. IMPRESSION: 1. No acute findings. 2. Mild degenerative changes of the acromioclavicular joint. Electronically signed by: Lonni Necessary MD  06/27/2024 06:33 PM EDT RP Workstation: [id number]   CT ANGIO HEAD NECK W WO CM Result Date: 06/27/2024 EXAM: CT HEAD WITHOUT CTA HEAD AND NECK WITH AND WITHOUT 06/27/2024 06:08:49 PM TECHNIQUE: CTA of the head and neck was performed with and without the administration of intravenous contrast. Noncontrast CT of the head with reconstructed 2-D images are also provided for review. Multiplanar 2D and/or 3D reformatted images are provided for review. Automated exposure control, iterative reconstruction, and/or weight based adjustment of the mA/kV was utilized to reduce the radiation dose to as low  as reasonably achievable. COMPARISON: None available CLINICAL HISTORY: Transient ischemic attack (TIA). Seizure. FINDINGS: CT HEAD: BRAIN AND VENTRICLES: No acute intracranial hemorrhage. No mass effect or midline shift. No extra-axial fluid collection. Gray-white differentiation is maintained. No hydrocephalus. Periventricular white matter hypoattenuation is mildly advanced for age. ORBITS: No acute abnormality. SINUSES: No acute abnormality. SOFT TISSUES AND SKULL: No acute abnormality. CTA NECK: AORTIC ARCH AND ARCH VESSELS: No dissection or arterial injury. No significant stenosis of the brachiocephalic or subclavian arteries. Common origin of the left common carotid and dominant artery is noted. CERVICAL CAROTID ARTERIES: No dissection, arterial injury, or hemodynamically significant stenosis by NASCET criteria. CERVICAL VERTEBRAL ARTERIES: No dissection, arterial injury, or significant stenosis. The right vertebral artery is dominant. VISUALIZED LUNGS AND MEDIASTINUM: Unremarkable. SOFT TISSUES: No acute abnormality. BONES: Mild degenerative retrolisthesis is present at C5-6. CTA HEAD: ANTERIOR CIRCULATION: No significant stenosis of the internal carotid arteries. No significant stenosis of the anterior cerebral arteries. No significant stenosis of the middle cerebral arteries. Mild segmental irregularity is present  in the distal MCA branch vessels without a significant proximal stenosis or occlusion. No aneurysm. POSTERIOR CIRCULATION: No significant stenosis of the posterior cerebral arteries. No significant stenosis of the basilar artery. No significant stenosis of the vertebral arteries. Mild narrowing of distal PCA branch vessels is present bilaterally. No aneurysm. OTHER: No dural venous sinus thrombosis on this non-dedicated study. IMPRESSION: 1. No acute intracranial abnormality. 2. Mildly advanced periventricular white matter hypoattenuation for age. This most likely reflects the sequelae of chronic microvascular ischemia. 3. Mild segmental irregularity in the distal MCA branch vessels without significant proximal stenosis or occlusion. Mild narrowing of distal PCA branch vessels bilaterally. Findings suggest intracranial atherosclerosis 4. Normal CTA of the neck. 5. No significant proximal stenosis or occlusion within the Circle of Willis. Electronically signed by: Lonni Necessary MD 06/27/2024 06:33 PM EDT RP Workstation: [id number]   DG Chest Portable 1 View Result Date: 06/27/2024 CLINICAL DATA:  Seizure EXAM: PORTABLE CHEST 1 VIEW COMPARISON:  Chest x-ray 10/03/2011 FINDINGS: The heart size and mediastinal contours are within normal limits. Both lungs are clear. The visualized skeletal structures are unremarkable. IMPRESSION: No active disease. Electronically Signed   By: Greig Pique M.D.   On: 06/27/2024 18:27    Intake/Output      08/31 0701 09/01 0700 09/01 0701 09/02 0700   P.O. 240    I.V. (mL/kg) 150 (1.6)    IV Piggyback     Total Intake(mL/kg) 390 (4.1)    Net +390         Urine Occurrence 1 x       ROS L shoulder Pain   Blood pressure (!) 152/93, pulse 63, temperature 98.7 F (37.1 C), resp. rate (!) 22, height 5' 9 (1.753 m), weight 96 kg, SpO2 94%. Physical Exam Vitals reviewed.  Constitutional:      General: He is awake. He is not in acute distress.    Comments:  Pleasant   Cardiovascular:     Heart sounds: S1 normal and S2 normal.  Pulmonary:     Comments: Unlabored  Musculoskeletal:     Comments:  Left upper extremity  Sling in place Mild swelling and bruising to L shoulder  No gross deformity  Ext warm  Radial, ulnar, median, axillary nv motor intact Generates a little bit of active abduction  Can passively abduct to 90 degrees  No blocks to motion  Elbow, forearm, wrist and hand motion is full  + radial pulse  Neurological:     Mental Status: He is alert.  Psychiatric:        Behavior: Behavior is cooperative.      Assessment/Plan:  58 year old male with poorly controlled diabetes s/p seizure with left rotator cuff tear  - Seizure  -Left rotator cuff tear  Near complete tear of infraspinatus and partial tear of supraspinatus   No acute interventions at this time.  Will have patient follow-up with one of our shoulder specialists for a full discussion of treatment options including surgical repair versus nonoperative management   Okay to use left upper extremity as tolerated   Sling for comfort and pain management  Start therapies once pain is under control   Add short course of Toradol    Ice as needed  - Pain management:  As above   - Dispo:  Stable for discharge from orthopedic standpoint  Follow-up with shoulder specialist in the next 7 to 10 days  Francis MICAEL Mt, PA-C [phone] (C) 06/29/2024, 11:43 AM  [facility] [address] [place] [address] [phone] MAXIMINO MILLING (F)    After 5pm and on the weekends please log on to Amion, go to orthopaedics and the look under the [facility] Call for the provider(s) on call. You can also call our office at [phone] and then follow the prompts to be connected to the call team.

## 2024-06-29 NOTE — Progress Notes (Signed)
 PROGRESS NOTE                                                                                                                                                                                                             Patient Demographics:    Francisco Werner, is a 58 y.o. male, DOB - January 29, 1966, FMW:[id number]  Outpatient Primary MD for the patient is Okey Carlin Redbird, MD    LOS - 2  Admit date - 06/27/2024    Chief Complaint  Patient presents with   Seizures       Brief Narrative (HPI from H&P)   58 y.o. male with hx of DM type 1 on insulin  pump, HTN, HLD, followed with [facility] for DM management, recent poison oak exposure and Rx for Prednisone outpatient, who presented after first seizure. Per his report has not been using insulin  pump over the past 2 months, although has been taking Lantus  18 units intermittently just not on daily basis. Did take this in the morning. He also had recently been Rx'd prednisone for poison oak. Has not been monitoring sugars. He has never had diabetic crisis in the past.  Patient had an episode of seizure with urinary incontinence and tongue bite, was brought to the ER for further care was found to be in DKA.   Subjective:   Patient in bed, appears comfortable, denies any headache, no fever, no chest pain or pressure, no shortness of breath , no abdominal pain. No focal weakness.  But intense left shoulder pain and discomfort, cannot move it.   Assessment  & Plan :   Status epilepticus due to likely metabolic derangements rising from DKA.  MRI brain, CT angiogram head and neck and EEG nonacute, he does have advanced intracranial atherosclerosis for which she will require strict secondary prevention and will be placed on aspirin  and statin, case discussed with neurologist on-call by the admitting physician and by me with Dr. Ellouise, no AEDs for now if no further seizures.  Medically  stable from the standpoint.  DKA in a patient with DM type I.  Diabetic and insulin  education, treated with DKA protocol, transition to subcu insulin  and monitor.  He has been extensively counseled on compliance, he was very noncompliant with his outpatient regimen prior to admission.  Lab Results  Component Value Date   HGBA1C  12.2 (H) 06/28/2024   CBG (last 3)  Recent Labs    06/28/24 1701 06/28/24 2122 06/29/24 0736  GLUCAP 296* 234* 219*   Ongoing left shoulder pain.  Incurred during the fall from seizure, highly suspicious for rotator cuff injury, x-ray stable, MRI positive for extensive tendon injury and tear, still no range of motion, continue left arm sling, orthopedics to see on 06/29/2024 continue pain control.  Lactic acidosis.  Due to above.  Advanced intracranial atherosclerosis.  Placed on aspirin  and statin.  Outpatient secondary prevention monitoring.  Hypertension.  ARB and as needed hydralazine .       Condition -fair  Family Communication  : None present  Code Status : Full code  Consults  : Neurology over the phone, orthopedics  PUD Prophylaxis :    Procedures  :     MRI left shoulder.   1. Large full-thickness, near complete, tear of the infraspinatus tendon with 3 cm of retraction. Severe infraspinatus muscle strain with a partial thickness tear along the posterior aspect near the musculotendinous junction. 2. Mild supraspinatus tendinosis with a small partial-thickness bursal surface tear towards the posterior insertion. 3. Moderate tendinosis of the intra-articular portion of the long head of the biceps tendon.  EEG.  Nonacute  MRI brain.  Nonacute but some chronic microvascular changes.  CT head and CT angiogram head and neck.1. No acute intracranial abnormality. 2. Mildly advanced periventricular white matter hypoattenuation for age. This most likely reflects the sequelae of chronic microvascular ischemia. 3. Mild segmental irregularity in the  distal MCA branch vessels without significant proximal stenosis or occlusion. Mild narrowing of distal PCA branch vessels bilaterally. Findings suggest intracranial atherosclerosis 4. Normal CTA of the neck. 5. No significant proximal stenosis or occlusion within the Circle of Willis      Disposition Plan  :    Status is: Inpatient   DVT Prophylaxis  :  Heparin   heparin  injection 5,000 Units Start: 06/28/24 1400 SCDs Start: 06/27/24 1945    Lab Results  Component Value Date   PLT 241 06/29/2024    Diet :  Diet Order             Diet Carb Modified Fluid consistency: Thin; Room service appropriate? Yes  Diet effective now                    Inpatient Medications  Scheduled Meds:  aspirin   81 mg Oral Daily   famotidine   20 mg Oral Daily   heparin  injection (subcutaneous)  5,000 Units Subcutaneous Q8H   insulin  aspart  0-15 Units Subcutaneous TID WC   insulin  aspart  0-5 Units Subcutaneous QHS   insulin  glargine-yfgn  18 Units Subcutaneous Daily   losartan   100 mg Oral Daily   rosuvastatin   10 mg Oral Daily   sodium chloride  flush  3 mL Intravenous Q12H   Continuous Infusions:  magnesium  sulfate bolus IVPB     PRN Meds:.acetaminophen , albuterol , cyclobenzaprine , dextrose , hydrALAZINE , magic mouthwash w/lidocaine , melatonin, ondansetron  (ZOFRAN ) IV, oxyCODONE  **OR** oxyCODONE , polyethylene glycol  Antibiotics  :    Anti-infectives (From admission, onward)    None         Objective:   Vitals:   06/29/24 0000 06/29/24 0449 06/29/24 0500 06/29/24 0732  BP: (!) 163/97 (!) 159/98    Pulse: 65 63    Resp: (!) 22     Temp: 98 F (36.7 C) 98.6 F (37 C)  98 F (36.7 C)  TempSrc: Axillary Oral  Oral  SpO2:      Weight:   96 kg   Height:        Wt Readings from Last 3 Encounters:  06/29/24 96 kg  09/30/14 101.6 kg  08/11/14 105.1 kg     Intake/Output Summary (Last 24 hours) at 06/29/2024 0932 Last data filed at 06/28/2024 1600 Gross per 24 hour   Intake 390.04 ml  Output --  Net 390.04 ml     Physical Exam  Awake Alert, No new F.N deficits, Normal affect [place].AT,PERRAL Supple Neck, No JVD,   Symmetrical Chest wall movement, Good air movement bilaterally, CTAB RRR,No Gallops,Rubs or new Murmurs,  +ve B.Sounds, Abd Soft, No tenderness,   Intense left shoulder pain with decreased range of motion    Data Review:    Recent Labs  Lab 06/27/24 1620 06/27/24 1639 06/27/24 1955 06/28/24 0753 06/29/24 0612  WBC 5.3  --   --  11.8* 6.3  HGB 14.7 16.3 14.3 13.0 14.5  HCT 44.1 48.0 42.0 38.7* 44.2  PLT 262  --   --  224 241  MCV 85.6  --   --  84.3 86.7  MCH 28.5  --   --  28.3 28.4  MCHC 33.3  --   --  33.6 32.8  RDW 11.9  --   --  12.1 12.4  LYMPHSABS  --   --   --  1.5 1.7  MONOABS  --   --   --  1.2* 0.7  EOSABS  --   --   --  0.0 0.2  BASOSABS  --   --   --  0.0 0.1    Recent Labs  Lab 06/27/24 1620 06/27/24 1639 06/27/24 1945 06/27/24 1953 06/27/24 1955 06/28/24 0009 06/28/24 0753 06/29/24 0612  NA 128* 128* 131*  --  132* 135 135 138  K 5.3* 5.4* 4.4  --  4.4 3.9 3.8 4.1  CL 94* 98 99  --   --  102 102 102  CO2 16*  --  20*  --   --  21* 24 23  ANIONGAP 18*  --  12  --   --  12 9 13   GLUCOSE 700* 685* 371*  --   --  209* 177* 224*  BUN 25* 28* 23*  --   --  16 12 15   CREATININE 1.55* 1.30* 1.23  --   --  1.04 1.08 1.23  AST  --   --   --   --   --   --  21  --   ALT  --   --   --   --   --   --  22  --   ALKPHOS  --   --   --   --   --   --  49  --   BILITOT  --   --   --   --   --   --  1.0  --   ALBUMIN  --   --   --   --   --   --  3.4*  --   LATICACIDVEN  --  7.5*  --  2.5*  --   --  1.1  --   TSH  --   --   --   --   --   --  0.401  --   HGBA1C  --   --   --   --   --   --  12.2*  --   AMMONIA  --   --   --   --   --   --  36*  --   MG  --   --   --   --   --   --  1.9 1.7  PHOS  --   --   --   --   --   --  3.9 3.6  CALCIUM  9.6  --  9.2  --   --  9.1 8.7* 9.2      Recent Labs  Lab  06/27/24 1620 06/27/24 1639 06/27/24 1945 06/27/24 1953 06/28/24 0009 06/28/24 0753 06/29/24 0612  LATICACIDVEN  --  7.5*  --  2.5*  --  1.1  --   TSH  --   --   --   --   --  0.401  --   HGBA1C  --   --   --   --   --  12.2*  --   AMMONIA  --   --   --   --   --  36*  --   MG  --   --   --   --   --  1.9 1.7  CALCIUM  9.6  --  9.2  --  9.1 8.7* 9.2    --------------------------------------------------------------------------------------------------------------- Lab Results  Component Value Date   CHOL 210 (H) 06/28/2024   HDL 78 06/28/2024   LDLCALC 114 (H) 06/28/2024   TRIG 90 06/28/2024   CHOLHDL 2.7 06/28/2024    Lab Results  Component Value Date   HGBA1C 12.2 (H) 06/28/2024   Recent Labs    06/28/24 0753  TSH 0.401   No results for input(s): VITAMINB12, FOLATE, FERRITIN, TIBC, IRON, RETICCTPCT in the last 72 hours. ------------------------------------------------------------------------------------------------------------------ Cardiac Enzymes No results for input(s): CKMB, TROPONINI, MYOGLOBIN in the last 168 hours.  Invalid input(s): CK  Micro Results No results found for this or any previous visit (from the past 240 hours).  Radiology Report MR SHOULDER LEFT WO CONTRAST Result Date: 06/29/2024 CLINICAL DATA:  Left shoulder pain status post fall EXAM: MRI OF THE LEFT SHOULDER WITHOUT CONTRAST TECHNIQUE: Multiplanar, multisequence MR imaging of the shoulder was performed. No intravenous contrast was administered. COMPARISON:  None Available. FINDINGS: Rotator cuff: Mild supraspinatus tendinosis with a small partial-thickness bursal surface tear towards the posterior insertion. Large full-thickness, near complete, tear of the infraspinatus tendon with 3 cm of retraction. Teres minor tendon is intact. Subscapularis tendon is intact. Muscles: Severe infraspinatus muscle strain with a partial thickness tear along the posterior aspect near the  musculotendinous junction. Mild supraspinatus muscle strain along the posterior margin. No muscle atrophy. Biceps Long Head: Moderate tendinosis of the intra-articular portion of the long head of the biceps tendon. Acromioclavicular Joint: Mild-moderate arthropathy of the acromioclavicular joint. Small amount of subacromial/subdeltoid bursal fluid. Glenohumeral Joint: Small joint effusion.  No chondral defect. Labrum: Grossly intact, but evaluation is limited by lack of intraarticular fluid/contrast. Bones: No fracture or dislocation. No aggressive osseous lesion. Other: No fluid collection or hematoma. IMPRESSION: 1. Large full-thickness, near complete, tear of the infraspinatus tendon with 3 cm of retraction. Severe infraspinatus muscle strain with a partial thickness tear along the posterior aspect near the musculotendinous junction. 2. Mild supraspinatus tendinosis with a small partial-thickness bursal surface tear towards the posterior insertion. 3. Moderate tendinosis of the intra-articular portion of the long head of the biceps tendon. Electronically Signed   By: Julaine Blanch M.D.   On:  06/29/2024 07:26   EEG adult Result Date: 06/28/2024 Shelton Arlin KIDD, MD     06/28/2024  7:06 AM Patient Name: Francisco Werner MRN: [id number] Epilepsy Attending: Arlin KIDD Shelton Referring Physician/Provider: Keturah Carrier, MD Date: 06/28/2024 Duration: 23.43 mins Patient history:  58 y.o. male with hx of DM type 1 on insulin  pump, HTN, HLD, followed with [facility] for DM management, recent poison oak exposure and Rx for Prednisone outpatient, who presented after first seizure. EEG to evaluate for seizure Level of alertness: Awake, asleep AEDs during EEG study: None Technical aspects: This EEG study was done with scalp electrodes positioned according to the 10-20 International system of electrode placement. Electrical activity was reviewed with band pass filter of 1-70Hz , sensitivity of 7 uV/mm, display speed  of 39mm/sec with a 60Hz  notched filter applied as appropriate. EEG data were recorded continuously and digitally stored.  Video monitoring was available and reviewed as appropriate. Description: The posterior dominant rhythm consists of 8-9Hz  activity of moderate voltage (25-35 uV) seen predominantly in posterior head regions, symmetric and reactive to eye opening and eye closing. Sleep was characterized by vertex waves, sleep spindles (12 to 14 Hz), maximal frontocentral region. Hyperventilation and photic stimulation were not performed.   IMPRESSION: This study is within normal limits. No seizures or epileptiform discharges were seen throughout the recording. A normal interictal EEG does not exclude the diagnosis of epilepsy. Arlin KIDD Shelton   MR BRAIN WO CONTRAST Result Date: 06/28/2024 CLINICAL DATA:  Initial evaluation for new onset seizure. EXAM: MRI HEAD WITHOUT CONTRAST TECHNIQUE: Multiplanar, multiecho pulse sequences of the brain and surrounding structures were obtained without intravenous contrast. COMPARISON:  Prior CT from 06/27/2024 FINDINGS: Brain: Mild diffuse prominence of the CSF and disease compatible generalized cerebral atrophy. Scattered patchy T2/FLAIR hyperintensity involving the periventricular deep white matter both cerebral hemispheres, most characteristic of chronic microvascular ischemic disease, mild-to-moderate in nature. FLAIR signal intensity involving the periventricular white matter adjacent to the temporal horns of both lateral ventricles noted, likely small vessel related. No abnormal foci of restricted diffusion to suggest acute or subacute ischemia. No signal changes to suggest postictal changes. Gray-white matter differentiation maintained. No areas of chronic cortical infarction. No acute or chronic intracranial blood products. No mass lesion, midline shift or mass effect. No hydrocephalus or extra-axial fluid collection. Pituitary gland and suprasellar region within  normal limits. No intrinsic temporal lobe abnormality. Vascular: Major intracranial vascular flow voids are maintained. Skull and upper cervical spine: Craniocervical junction within normal limits. Bone marrow signal intensity overall within normal limits. No scalp soft tissue abnormality. Sinuses/Orbits: Globes and orbital soft tissues within normal limits. Paranasal sinuses are largely clear. No mastoid effusion. Other: None. IMPRESSION: 1. No acute intracranial abnormality. 2. Mild to moderate cerebral white matter disease, most characteristic of chronic microvascular ischemic disease. Electronically Signed   By: Morene Hoard M.D.   On: 06/28/2024 02:49   DG Shoulder Left Result Date: 06/27/2024 EXAM: 1 VIEW XRAY OF THE LEFT SHOULDER 06/27/2024 06:17:08 PM COMPARISON: None available. CLINICAL HISTORY: Pain post seizure. FINDINGS: BONES AND JOINTS: Glenohumeral joint is normally aligned. No acute fracture or dislocation. Mild degenerative changes of the acromioclavicular joint. SOFT TISSUES: No abnormal calcifications. Visualized lung is unremarkable. IMPRESSION: 1. No acute findings. 2. Mild degenerative changes of the acromioclavicular joint. Electronically signed by: Lonni Necessary MD 06/27/2024 06:33 PM EDT RP Workstation: [id number]   CT ANGIO HEAD NECK W WO CM Result Date: 06/27/2024 EXAM: CT HEAD WITHOUT CTA HEAD AND NECK  WITH AND WITHOUT 06/27/2024 06:08:49 PM TECHNIQUE: CTA of the head and neck was performed with and without the administration of intravenous contrast. Noncontrast CT of the head with reconstructed 2-D images are also provided for review. Multiplanar 2D and/or 3D reformatted images are provided for review. Automated exposure control, iterative reconstruction, and/or weight based adjustment of the mA/kV was utilized to reduce the radiation dose to as low as reasonably achievable. COMPARISON: None available CLINICAL HISTORY: Transient ischemic attack (TIA). Seizure.  FINDINGS: CT HEAD: BRAIN AND VENTRICLES: No acute intracranial hemorrhage. No mass effect or midline shift. No extra-axial fluid collection. Gray-white differentiation is maintained. No hydrocephalus. Periventricular white matter hypoattenuation is mildly advanced for age. ORBITS: No acute abnormality. SINUSES: No acute abnormality. SOFT TISSUES AND SKULL: No acute abnormality. CTA NECK: AORTIC ARCH AND ARCH VESSELS: No dissection or arterial injury. No significant stenosis of the brachiocephalic or subclavian arteries. Common origin of the left common carotid and dominant artery is noted. CERVICAL CAROTID ARTERIES: No dissection, arterial injury, or hemodynamically significant stenosis by NASCET criteria. CERVICAL VERTEBRAL ARTERIES: No dissection, arterial injury, or significant stenosis. The right vertebral artery is dominant. VISUALIZED LUNGS AND MEDIASTINUM: Unremarkable. SOFT TISSUES: No acute abnormality. BONES: Mild degenerative retrolisthesis is present at C5-6. CTA HEAD: ANTERIOR CIRCULATION: No significant stenosis of the internal carotid arteries. No significant stenosis of the anterior cerebral arteries. No significant stenosis of the middle cerebral arteries. Mild segmental irregularity is present in the distal MCA branch vessels without a significant proximal stenosis or occlusion. No aneurysm. POSTERIOR CIRCULATION: No significant stenosis of the posterior cerebral arteries. No significant stenosis of the basilar artery. No significant stenosis of the vertebral arteries. Mild narrowing of distal PCA branch vessels is present bilaterally. No aneurysm. OTHER: No dural venous sinus thrombosis on this non-dedicated study. IMPRESSION: 1. No acute intracranial abnormality. 2. Mildly advanced periventricular white matter hypoattenuation for age. This most likely reflects the sequelae of chronic microvascular ischemia. 3. Mild segmental irregularity in the distal MCA branch vessels without significant  proximal stenosis or occlusion. Mild narrowing of distal PCA branch vessels bilaterally. Findings suggest intracranial atherosclerosis 4. Normal CTA of the neck. 5. No significant proximal stenosis or occlusion within the Circle of Willis. Electronically signed by: Lonni Necessary MD 06/27/2024 06:33 PM EDT RP Workstation: [id number]   DG Chest Portable 1 View Result Date: 06/27/2024 CLINICAL DATA:  Seizure EXAM: PORTABLE CHEST 1 VIEW COMPARISON:  Chest x-ray 10/03/2011 FINDINGS: The heart size and mediastinal contours are within normal limits. Both lungs are clear. The visualized skeletal structures are unremarkable. IMPRESSION: No active disease. Electronically Signed   By: Greig Pique M.D.   On: 06/27/2024 18:27     Signature  -   Lavada Stank M.D on 06/29/2024 at 9:32 AM   -  To page go to [email or web address]

## 2024-06-29 NOTE — Discharge Instructions (Signed)
 Orthopaedic Discharge instructions   Sling for comfort left shoulder Okay to move left upper extremity as you can tolerate Ice as needed for swelling and pain control No formal restrictions   Do not drive, operate heavy machinery, perform activities at heights, swimming or participation in water activities or provide baby sitting services until you have seen by Primary MD or a Neurologist and advised to do so again.  Follow with Primary MD Okey Carlin Redbird, MD in 7 days   Get CBC, CMP, Magnesium , 2 view Chest X ray -  checked next visit with your primary MD    Activity: As tolerated with Full fall precautions use walker/cane & assistance as needed  Disposition Home    Diet: Heart Healthy Low Carb  Accuchecks 4 times/day, Once in AM empty stomach and then before each meal. Log in all results and show them to your Prim.MD in 3 days. If any glucose reading is under 80 or above 300 call your Prim MD immidiately. Follow Low glucose instructions for glucose under 80 as instructed.   Special Instructions: If you have smoked or chewed Tobacco  in the last 2 yrs please stop smoking, stop any regular Alcohol  and or any Recreational drug use.  On your next visit with your primary care physician please Get Medicines reviewed and adjusted.  Please request your Prim.MD to go over all Hospital Tests and Procedure/Radiological results at the follow up, please get all Hospital records sent to your Prim MD by signing hospital release before you go home.  If you experience worsening of your admission symptoms, develop shortness of breath, life threatening emergency, suicidal or homicidal thoughts you must seek medical attention immediately by calling 911 or calling your MD immediately  if symptoms less severe.  You Must read complete instructions/literature along with all the possible adverse reactions/side effects for all the Medicines you take and that have been prescribed to you. Take any new  Medicines after you have completely understood and accpet all the possible adverse reactions/side effects.   Do not drive when taking Pain medications.  Do not take more than prescribed Pain, Sleep and Anxiety Medications  Wear Seat belts while driving.

## 2024-06-29 NOTE — Evaluation (Signed)
 Occupational Therapy Evaluation and Discharge Patient Details Name: Francisco Werner MRN: [id number] DOB: November 06, 1965 Today's Date: 06/29/2024   History of Present Illness   58 y.o. male who presented after first seizure. Per his report has not been using insulin  pump over the past 2 months, although has been taking Lantus  18 units intermittently just not on daily basis. Recently been Rx'd prednisone for poison oak. Has not been monitoring sugars. He has never had diabetic crisis in the past.  Patient had an episode of seizure with urinary incontinence and tongue bite, was brought to the ER for further care was found to be in DKA. Injured left shoulder during seizure with MRI showing: near complete tear of infraspinatus and partial tear of supraspinatus. PHMx:  DM type 1 on insulin  pump, HTN, HLD.     Clinical Impressions This 59 yo male admitted with above and seen for LUE with ADLs. Pt and wife educated and given handout on how to modify ADLs if needed for LUE pain and decreased movement as well as handouts on LUE exercises to keep arm moving and in sling (for comfort only) all the time. Pt and wife verbalized understanding, we will D/C from acute OT.     If plan is discharge home, recommend the following:   A little help with bathing/dressing/bathroom;Assistance with cooking/housework;Assist for transportation     Functional Status Assessment   Patient has had a recent decline in their functional status and demonstrates the ability to make significant improvements in function in a reasonable and predictable amount of time. (without further OT Needs)     Equipment Recommendations   None recommended by OT      Precautions/Restrictions   Precautions Precaution/Restrictions Comments: no precautions for shoulder--use as tolerated per ortho consult note Required Braces or Orthoses: Sling (for comfort) Restrictions Weight Bearing Restrictions Per Provider Order: No     Mobility  Bed Mobility Overal bed mobility: Modified Independent                  Transfers  Pt and wife report pt is moving without issues when up on his feet in room.                        Balance  Pt and wife do not report any balance issues.                                         ADL either performed or assessed with clinical judgement   ADL                                         General ADL Comments: Educated and provided handout on ways he can modify ADLs given shoulder injury (given post op shoulder handout); also encouraged to wear only elastic waist pants and slip on shoes     Vision Patient Visual Report: No change from baseline              Pertinent Vitals/Pain Pain Assessment Pain Location: reports if he does not move his shoulder it does not hurt, only hurts if he moves it     Extremity/Trunk Assessment Upper Extremity Assessment Upper Extremity Assessment: Left hand dominant;LUE deficits/detail LUE Deficits / Details: Does not want to move shoulder  due to pain--made it known that ortho said he can move and use it to tolerance- LUE Coordination: decreased gross motor           Communication Communication Communication: No apparent difficulties   Cognition Arousal: Alert Behavior During Therapy: WFL for tasks assessed/performed                                 Following commands: Intact       Cueing  Cueing Techniques: Verbal cues      Exercises Other Exercises Other Exercises: Educated pt and wife on pt using LUE as much as he can tolerate and at the least do the elbow, wrist, forearm, and elbow exercises. Educated on how to [employer] sling (use waist strap at night)--put in order for X-large sling. Also provided handouts for shoulder AAROM flexion, adduction, internal and external rotation, lab slides, dangle, and pendulums with encouragement to do what he can tolerate.         Home Living Family/patient expects to be discharged to:: Private residence Living Arrangements: Spouse/significant other Available Help at Discharge: Family;Available 24 hours/day Type of Home: House Home Access: Stairs to enter [employer] of Steps: 4 Entrance Stairs-Rails: None Home Layout: One level     Bathroom Shower/Tub: Producer, television/film/video: Standard     Home Equipment: None          Prior Functioning/Environment Prior Level of Function : Independent/Modified Independent                    OT Problem List: Decreased strength;Decreased range of motion;Pain        OT Goals(Current goals can be found in the care plan section)   Acute Rehab OT Goals Patient Stated Goal: for arm to feel better         AM-PAC OT 6 Clicks Daily Activity     Outcome Measure Help from another person eating meals?: A Little Help from another person taking care of personal grooming?: A Little Help from another person toileting, which includes using toliet, bedpan, or urinal?: A Little Help from another person bathing (including washing, rinsing, drying)?: A Little Help from another person to put on and taking off regular upper body clothing?: A Lot Help from another person to put on and taking off regular lower body clothing?: A Little 6 Click Score: 17   End of Session Equipment Utilized During Treatment:  (immobilizer sling) Nurse Communication:  (pt safe to get up and about to go to bathroom on his own--will have to disconnect telemetry or get him a portable box)  Activity Tolerance: Patient tolerated treatment well Patient left:  (sitting EOB)  OT Visit Diagnosis: Pain Pain - Right/Left: Left Pain - part of body: Shoulder                Time: 8783-8759 OT Time Calculation (min): 24 min Charges:  OT General Charges $OT Visit: 1 Visit OT Evaluation $OT Eval Moderate Complexity: 1 Mod OT Treatments $Self Care/Home Management : 8-22  mins  Francisco Werner OT Acute Rehabilitation Services Office [phone]    Francisco Werner 06/29/2024, 2:16 PM

## 2024-06-30 ENCOUNTER — Other Ambulatory Visit (HOSPITAL_COMMUNITY): Payer: Self-pay

## 2024-06-30 DIAGNOSIS — R569 Unspecified convulsions: Secondary | ICD-10-CM | POA: Diagnosis not present

## 2024-06-30 LAB — GLUCOSE, CAPILLARY: Glucose-Capillary: 195 mg/dL — ABNORMAL HIGH (ref 70–99)

## 2024-06-30 MED ORDER — OXYCODONE HCL 5 MG PO TABS
5.0000 mg | ORAL_TABLET | Freq: Three times a day (TID) | ORAL | 0 refills | Status: AC | PRN
  Filled 2024-06-30: qty 15, 5d supply, fill #0

## 2024-06-30 MED ORDER — NOVOLOG 100 UNIT/ML IJ SOLN: INTRAMUSCULAR | Status: AC

## 2024-06-30 MED ORDER — LANTUS SOLOSTAR 100 UNIT/ML ~~LOC~~ SOPN
25.0000 [IU] | PEN_INJECTOR | Freq: Every day | SUBCUTANEOUS | 0 refills | Status: AC
  Filled 2024-06-30: qty 15, 60d supply, fill #0

## 2024-06-30 MED ORDER — MAGIC MOUTHWASH W/LIDOCAINE
5.0000 mL | Freq: Three times a day (TID) | ORAL | 0 refills | Status: AC | PRN
  Filled 2024-06-30: qty 160, 11d supply, fill #0

## 2024-06-30 MED ORDER — ACETAMINOPHEN 500 MG PO TABS
500.0000 mg | ORAL_TABLET | Freq: Three times a day (TID) | ORAL | 0 refills | Status: AC | PRN
Start: 2024-06-30 — End: ?
  Filled 2024-06-30: qty 20, 7d supply, fill #0

## 2024-06-30 MED ORDER — ROSUVASTATIN CALCIUM 10 MG PO TABS
10.0000 mg | ORAL_TABLET | Freq: Every day | ORAL | 0 refills | Status: AC
  Filled 2024-06-30: qty 30, 30d supply, fill #0

## 2024-06-30 MED ORDER — ASPIRIN 81 MG PO CHEW
81.0000 mg | CHEWABLE_TABLET | Freq: Every day | ORAL | 0 refills | Status: AC
  Filled 2024-06-30: qty 30, 30d supply, fill #0

## 2024-06-30 NOTE — TOC Transition Note (Signed)
 Transition of Care [facility]) - Discharge Note   Patient Details  Name: Francisco Werner MRN: [id number] Date of Birth: 12-Jul-1966  [facility] CM/SW Contact:  [name], RN Phone Number: 06/30/2024, 8:29 AM   Clinical Narrative:     Beatris w patient he prefers outpatient PT. Discussed locations and referral [phone] made to [facility] for PT OT.  Added toAVS and patient instructed to call for faster scheduling  Final next level of care: Home/Self Care Barriers to Discharge: No Barriers Identified   Patient Goals and CMS Choice            Discharge Placement                       Discharge Plan and Services Additional resources added to the After Visit Summary for     Discharge Planning Services: CM Consult                                 Social Drivers of Health (SDOH) Interventions SDOH Screenings   Food Insecurity: No Food Insecurity (06/28/2024)  Housing: Low Risk  (06/28/2024)  Transportation Needs: No Transportation Needs (06/28/2024)  Utilities: Not At Risk (06/28/2024)  Tobacco Use: Low Risk  (06/27/2024)     Readmission Risk Interventions     No data to display

## 2024-06-30 NOTE — Plan of Care (Signed)
                                      [facility]                            [address] [place], [place] [address]      Francisco Werner was admitted to the Hospital on 06/27/2024 and Discharged  06/30/2024 and should be excused from work/school   for 14 days starting from date -  06/27/2024 , may return to work/school without any restrictions.  Call Lavada Stank MD, [facility]  [phone] with questions.  Lavada Stank M.D on 06/30/2024,at 7:33 AM  [facility]   Office  [phone]

## 2024-06-30 NOTE — Plan of Care (Signed)
  Problem: Coping: Goal: Ability to adjust to condition or change in health will improve Outcome: Progressing   Problem: Fluid Volume: Goal: Ability to maintain a balanced intake and output will improve Outcome: Progressing   Problem: Health Behavior/Discharge Planning: Goal: Ability to manage health-related needs will improve Outcome: Progressing   Problem: Metabolic: Goal: Ability to maintain appropriate glucose levels will improve Outcome: Progressing   Problem: Nutritional: Goal: Maintenance of adequate nutrition will improve Outcome: Progressing   Problem: Skin Integrity: Goal: Risk for impaired skin integrity will decrease Outcome: Progressing   Problem: Tissue Perfusion: Goal: Adequacy of tissue perfusion will improve Outcome: Progressing   Problem: Cardiac: Goal: Ability to maintain an adequate cardiac output will improve Outcome: Progressing   Problem: Metabolic: Goal: Ability to maintain appropriate glucose levels will improve Outcome: Progressing   Problem: Respiratory: Goal: Will regain and/or maintain adequate ventilation Outcome: Progressing   Problem: Urinary Elimination: Goal: Ability to achieve and maintain adequate renal perfusion and functioning will improve Outcome: Progressing   Problem: Health Behavior/Discharge Planning: Goal: Ability to manage health-related needs will improve Outcome: Progressing   Problem: Clinical Measurements: Goal: Ability to maintain clinical measurements within normal limits will improve Outcome: Progressing Goal: Will remain free from infection Outcome: Progressing Goal: Diagnostic test results will improve Outcome: Progressing Goal: Respiratory complications will improve Outcome: Progressing Goal: Cardiovascular complication will be avoided Outcome: Progressing   Problem: Activity: Goal: Risk for activity intolerance will decrease Outcome: Progressing   Problem: Nutrition: Goal: Adequate nutrition will  be maintained Outcome: Progressing   Problem: Coping: Goal: Level of anxiety will decrease Outcome: Progressing   Problem: Elimination: Goal: Will not experience complications related to bowel motility Outcome: Progressing Goal: Will not experience complications related to urinary retention Outcome: Progressing   Problem: Pain Managment: Goal: General experience of comfort will improve and/or be controlled Outcome: Progressing   Problem: Safety: Goal: Ability to remain free from injury will improve Outcome: Progressing   Problem: Skin Integrity: Goal: Risk for impaired skin integrity will decrease Outcome: Progressing

## 2024-06-30 NOTE — Discharge Summary (Signed)
 Francisco Werner FMW:[id number] DOB: [birth year] DOA: 06/27/2024  PCP: Francisco Carlin Redbird, MD  Admit date: 06/27/2024  Discharge date: 06/30/2024  Admitted From: Home   Disposition:  Home   Recommendations for Outpatient Follow-up:   Follow up with PCP in 1-2 weeks  PCP Please obtain BMP/CBC, 2 view CXR in 1week,  (see Discharge instructions)   PCP Please follow up on the following pending results:    Home Health: PT, OT if qualifies   Equipment/Devices: shoulder Sling  Consultations: Ortho Discharge Condition: Stable    CODE STATUS: Full    Diet Recommendation: Heart Healthy Low Carb    Chief Complaint  Patient presents with   Seizures     Brief history of present illness from the day of admission and additional interim summary    58 y.o. male with hx of DM type 1 on insulin  pump, HTN, HLD, followed with [facility] Endocrinology for DM management, recent poison oak exposure and Rx for Prednisone outpatient, who presented after first seizure. Per his report has not been using insulin  pump over the past 2 months, although has been taking Lantus  18 units intermittently just not on daily basis. Did take this in the morning. He also had recently been Rx'd prednisone for poison oak. Has not been monitoring sugars. He has never had diabetic crisis in the past.  Patient had an episode of seizure with urinary incontinence and tongue bite, was brought to the ER for further care was found to be in DKA.                                                                  Hospital Course   Status epilepticus due to likely metabolic derangements rising from DKA.  MRI brain, CT angiogram head and neck and EEG nonacute, he does have advanced intracranial atherosclerosis for which she will require strict secondary prevention and will be  placed on aspirin  and statin, case discussed with neurologist on-call by the admitting physician and by me with Dr. Ellouise, no AEDs for now if no further seizures.  Medically stable from the standpoint.  Will have him follow-up with neurology on a close basis in the outpatient setting, no driving instructions provided.   DKA in a patient with DM type I.  Diabetic and insulin  education, treated with DKA protocol, transition to subcu insulin  and monitor.  He has been extensively counseled on compliance, he was very noncompliant with his outpatient regimen prior to admission.  Lab Results  Component Value Date   HGBA1C 12.2 (H) 06/28/2024   CBG (last 3)  Recent Labs    06/29/24 1603 06/29/24 2108 06/30/24 0752  GLUCAP 187* 245* 195*     Left shoulder rotator cuff injury.  Incurred during the fall from seizure, and by orthopedics, left arm  placed in sling, PT OT with outpatient Ortho follow-up   Lactic acidosis.  Due to above.   Advanced intracranial atherosclerosis.  Placed on aspirin  and statin.  Outpatient secondary prevention monitoring.   Hypertension.  ARB and as needed hydralazine .  Discharge diagnosis     Principal Problem:   Seizure (HCC) Active Problems:   Status epilepticus (HCC)   DKA (diabetic ketoacidosis) (HCC)   Hyperosmolar hyperglycemic state (HHS) (HCC)   Lactic acidosis   Left shoulder pain    Discharge instructions    Discharge Instructions     Discharge instructions   Complete by: As directed    Orthopaedic Discharge instructions   Sling for comfort left shoulder Okay to move left upper extremity as you can tolerate Ice as needed for swelling and pain control No formal restrictions   Do not drive, operate heavy machinery, perform activities at heights, swimming or participation in water activities or provide baby sitting services until you have seen by Primary MD or a Neurologist and advised to do so again.  Follow with Primary MD Francisco Carlin Redbird, MD in 7 days   Get CBC, CMP, Magnesium , 2 view Chest X ray -  checked next visit with your primary MD    Activity: As tolerated with Full fall precautions use walker/cane & assistance as needed  Disposition Home    Diet: Heart Healthy Low Carb  Accuchecks 4 times/day, Once in AM empty stomach and then before each meal. Log in all results and show them to your Prim.MD in 3 days. If any glucose reading is under 80 or above 300 call your Prim MD immidiately. Follow Low glucose instructions for glucose under 80 as instructed.   Special Instructions: If you have smoked or chewed Tobacco  in the last 2 yrs please stop smoking, stop any regular Alcohol  and or any Recreational drug use.  On your next visit with your primary care physician please Get Medicines reviewed and adjusted.  Please request your Prim.MD to go over all Hospital Tests and Procedure/Radiological results at the follow up, please get all Hospital records sent to your Prim MD by signing hospital release before you go home.  If you experience worsening of your admission symptoms, develop shortness of breath, life threatening emergency, suicidal or homicidal thoughts you must seek medical attention immediately by calling 911 or calling your MD immediately  if symptoms less severe.  You Must read complete instructions/literature along with all the possible adverse reactions/side effects for all the Medicines you take and that have been prescribed to you. Take any new Medicines after you have completely understood and accpet all the possible adverse reactions/side effects.   Do not drive when taking Pain medications.  Do not take more than prescribed Pain, Sleep and Anxiety Medications  Wear Seat belts while driving.       Discharge Medications   Allergies as of 06/30/2024       Reactions   Naproxen Other (See Comments)   GI bleed   Shellfish-derived Products Swelling        Medication List     STOP  taking these medications    atorvastatin  10 MG tablet Commonly known as: LIPITOR   predniSONE 20 MG tablet Commonly known as: DELTASONE       TAKE these medications    acetaminophen  500 MG tablet Commonly known as: TYLENOL  Take 1 tablet (500 mg total) by mouth every 8 (eight) hours as needed for mild pain (pain score 1-3) or  moderate pain (pain score 4-6).   aspirin  81 MG chewable tablet Chew 1 tablet (81 mg total) by mouth daily.   hydrOXYzine 25 MG tablet Commonly known as: ATARAX Take 25 mg by mouth 3 (three) times daily. 7 day therapy for poison ivy   Lantus  SoloStar 100 UNIT/ML Solostar Pen Generic drug: insulin  glargine Inject 25 Units into the skin daily. What changed: how much to take   losartan  50 MG tablet Commonly known as: COZAAR  Take 100 mg by mouth daily.   magic mouthwash w/lidocaine  Soln Take 5 mLs by mouth 3 (three) times daily as needed for mouth pain. Suspension contains equal amounts of Maalox Extra Strength, nystatin, diphenhydramine and lidocaine .   NovoLOG  100 UNIT/ML injection Generic drug: insulin  aspart Before each meal 3 times a day, 140-199 - 2 units, 200-250 - 4 units, 251-299 - 8 units,  300-349 - 10 units,  350 or above 14 units. What changed:  how much to take how to take this when to take this additional instructions   Omnipod 5 DexG7G6 Intro Gen 5 Kit 1 each See admin instructions. Every 3 days   OVER THE COUNTER MEDICATION Take 1 Dose by mouth daily. Black seed oil (Nigella sativa)   oxyCODONE  5 MG immediate release tablet Commonly known as: Oxy IR/ROXICODONE  Take 1 tablet (5 mg total) by mouth every 8 (eight) hours as needed for severe pain (pain score 7-10) or breakthrough pain.   rosuvastatin  10 MG tablet Commonly known as: CRESTOR  Take 1 tablet (10 mg total) by mouth daily.         Follow-up Information     Sharl Selinda Dover, MD. Schedule an appointment as soon as possible for a visit in 7 day(s).    Specialty: Orthopedic Surgery Why: follow up regarding Left rotator cuff tear Contact information: [address] [place] [address] [phone]         Francisco Carlin Redbird, MD. Schedule an appointment as soon as possible for a visit in 1 week(s).   Specialty: Family Medicine Contact information: [address] [place] [address] [phone]         [facility]. Schedule an appointment as soon as possible for a visit in 1 week(s).   Contact information: [address]     [address] [place]  [address] [phone]                Major procedures and Radiology Reports - PLEASE review detailed and final reports thoroughly  -      MR SHOULDER LEFT WO CONTRAST Result Date: 06/29/2024 CLINICAL DATA:  Left shoulder pain status post fall EXAM: MRI OF THE LEFT SHOULDER WITHOUT CONTRAST TECHNIQUE: Multiplanar, multisequence MR imaging of the shoulder was performed. No intravenous contrast was administered. COMPARISON:  None Available. FINDINGS: Rotator cuff: Mild supraspinatus tendinosis with a small partial-thickness bursal surface tear towards the posterior insertion. Large full-thickness, near complete, tear of the infraspinatus tendon with 3 cm of retraction. Teres minor tendon is intact. Subscapularis tendon is intact. Muscles: Severe infraspinatus muscle strain with a partial thickness tear along the posterior aspect near the musculotendinous junction. Mild supraspinatus muscle strain along the posterior margin. No muscle atrophy. Biceps Long Head: Moderate tendinosis of the intra-articular portion of the long head of the biceps tendon. Acromioclavicular Joint: Mild-moderate arthropathy of the acromioclavicular joint. Small amount of subacromial/subdeltoid bursal fluid. Glenohumeral Joint: Small joint effusion.  No chondral defect. Labrum: Grossly intact, but evaluation is limited by lack  of intraarticular  fluid/contrast. Bones: No fracture or dislocation. No aggressive osseous lesion. Other: No fluid collection or hematoma. IMPRESSION: 1. Large full-thickness, near complete, tear of the infraspinatus tendon with 3 cm of retraction. Severe infraspinatus muscle strain with a partial thickness tear along the posterior aspect near the musculotendinous junction. 2. Mild supraspinatus tendinosis with a small partial-thickness bursal surface tear towards the posterior insertion. 3. Moderate tendinosis of the intra-articular portion of the long head of the biceps tendon. Electronically Signed   By: Julaine Blanch M.D.   On: 06/29/2024 07:26   EEG adult Result Date: 06/28/2024 Shelton Arlin KIDD, MD     06/28/2024  7:06 AM Patient Name: Francisco Werner MRN: [id number] Epilepsy Attending: Arlin KIDD Shelton Referring Physician/Provider: Keturah Carrier, MD Date: 06/28/2024 Duration: 23.43 mins Patient history:  58 y.o. male with hx of DM type 1 on insulin  pump, HTN, HLD, followed with [facility] Endocrinology for DM management, recent poison oak exposure and Rx for Prednisone outpatient, who presented after first seizure. EEG to evaluate for seizure Level of alertness: Awake, asleep AEDs during EEG study: None Technical aspects: This EEG study was done with scalp electrodes positioned according to the 10-20 International system of electrode placement. Electrical activity was reviewed with band pass filter of 1-70Hz , sensitivity of 7 uV/mm, display speed of 25mm/sec with a 60Hz  notched filter applied as appropriate. EEG data were recorded continuously and digitally stored.  Video monitoring was available and reviewed as appropriate. Description: The posterior dominant rhythm consists of 8-9Hz  activity of moderate voltage (25-35 uV) seen predominantly in posterior head regions, symmetric and reactive to eye opening and eye closing. Sleep was characterized by vertex waves, sleep spindles (12 to 14 Hz), maximal frontocentral region.  Hyperventilation and photic stimulation were not performed.   IMPRESSION: This study is within normal limits. No seizures or epileptiform discharges were seen throughout the recording. A normal interictal EEG does not exclude the diagnosis of epilepsy. Arlin KIDD Shelton   MR BRAIN WO CONTRAST Result Date: 06/28/2024 CLINICAL DATA:  Initial evaluation for new onset seizure. EXAM: MRI HEAD WITHOUT CONTRAST TECHNIQUE: Multiplanar, multiecho pulse sequences of the brain and surrounding structures were obtained without intravenous contrast. COMPARISON:  Prior CT from 06/27/2024 FINDINGS: Brain: Mild diffuse prominence of the CSF and disease compatible generalized cerebral atrophy. Scattered patchy T2/FLAIR hyperintensity involving the periventricular deep white matter both cerebral hemispheres, most characteristic of chronic microvascular ischemic disease, mild-to-moderate in nature. FLAIR signal intensity involving the periventricular white matter adjacent to the temporal horns of both lateral ventricles noted, likely small vessel related. No abnormal foci of restricted diffusion to suggest acute or subacute ischemia. No signal changes to suggest postictal changes. Gray-white matter differentiation maintained. No areas of chronic cortical infarction. No acute or chronic intracranial blood products. No mass lesion, midline shift or mass effect. No hydrocephalus or extra-axial fluid collection. Pituitary gland and suprasellar region within normal limits. No intrinsic temporal lobe abnormality. Vascular: Major intracranial vascular flow voids are maintained. Skull and upper cervical spine: Craniocervical junction within normal limits. Bone marrow signal intensity overall within normal limits. No scalp soft tissue abnormality. Sinuses/Orbits: Globes and orbital soft tissues within normal limits. Paranasal sinuses are largely clear. No mastoid effusion. Other: None. IMPRESSION: 1. No acute intracranial abnormality. 2. Mild  to moderate cerebral white matter disease, most characteristic of chronic microvascular ischemic disease. Electronically Signed   By: Morene Hoard M.D.   On: 06/28/2024 02:49   DG Shoulder Left Result Date: 06/27/2024 EXAM:  1 VIEW XRAY OF THE LEFT SHOULDER 06/27/2024 06:17:08 PM COMPARISON: None available. CLINICAL HISTORY: Pain post seizure. FINDINGS: BONES AND JOINTS: Glenohumeral joint is normally aligned. No acute fracture or dislocation. Mild degenerative changes of the acromioclavicular joint. SOFT TISSUES: No abnormal calcifications. Visualized lung is unremarkable. IMPRESSION: 1. No acute findings. 2. Mild degenerative changes of the acromioclavicular joint. Electronically signed by: Lonni Necessary MD 06/27/2024 06:33 PM EDT RP Workstation: [id number]   CT ANGIO HEAD NECK W WO CM Result Date: 06/27/2024 EXAM: CT HEAD WITHOUT CTA HEAD AND NECK WITH AND WITHOUT 06/27/2024 06:08:49 PM TECHNIQUE: CTA of the head and neck was performed with and without the administration of intravenous contrast. Noncontrast CT of the head with reconstructed 2-D images are also provided for review. Multiplanar 2D and/or 3D reformatted images are provided for review. Automated exposure control, iterative reconstruction, and/or weight based adjustment of the mA/kV was utilized to reduce the radiation dose to as low as reasonably achievable. COMPARISON: None available CLINICAL HISTORY: Transient ischemic attack (TIA). Seizure. FINDINGS: CT HEAD: BRAIN AND VENTRICLES: No acute intracranial hemorrhage. No mass effect or midline shift. No extra-axial fluid collection. Gray-white differentiation is maintained. No hydrocephalus. Periventricular white matter hypoattenuation is mildly advanced for age. ORBITS: No acute abnormality. SINUSES: No acute abnormality. SOFT TISSUES AND SKULL: No acute abnormality. CTA NECK: AORTIC ARCH AND ARCH VESSELS: No dissection or arterial injury. No significant stenosis of the  brachiocephalic or subclavian arteries. Common origin of the left common carotid and dominant artery is noted. CERVICAL CAROTID ARTERIES: No dissection, arterial injury, or hemodynamically significant stenosis by NASCET criteria. CERVICAL VERTEBRAL ARTERIES: No dissection, arterial injury, or significant stenosis. The right vertebral artery is dominant. VISUALIZED LUNGS AND MEDIASTINUM: Unremarkable. SOFT TISSUES: No acute abnormality. BONES: Mild degenerative retrolisthesis is present at C5-6. CTA HEAD: ANTERIOR CIRCULATION: No significant stenosis of the internal carotid arteries. No significant stenosis of the anterior cerebral arteries. No significant stenosis of the middle cerebral arteries. Mild segmental irregularity is present in the distal MCA branch vessels without a significant proximal stenosis or occlusion. No aneurysm. POSTERIOR CIRCULATION: No significant stenosis of the posterior cerebral arteries. No significant stenosis of the basilar artery. No significant stenosis of the vertebral arteries. Mild narrowing of distal PCA branch vessels is present bilaterally. No aneurysm. OTHER: No dural venous sinus thrombosis on this non-dedicated study. IMPRESSION: 1. No acute intracranial abnormality. 2. Mildly advanced periventricular white matter hypoattenuation for age. This most likely reflects the sequelae of chronic microvascular ischemia. 3. Mild segmental irregularity in the distal MCA branch vessels without significant proximal stenosis or occlusion. Mild narrowing of distal PCA branch vessels bilaterally. Findings suggest intracranial atherosclerosis 4. Normal CTA of the neck. 5. No significant proximal stenosis or occlusion within the Circle of Willis. Electronically signed by: Lonni Necessary MD 06/27/2024 06:33 PM EDT RP Workstation: [id number]   DG Chest Portable 1 View Result Date: 06/27/2024 CLINICAL DATA:  Seizure EXAM: PORTABLE CHEST 1 VIEW COMPARISON:  Chest x-ray 10/03/2011 FINDINGS:  The heart size and mediastinal contours are within normal limits. Both lungs are clear. The visualized skeletal structures are unremarkable. IMPRESSION: No active disease. Electronically Signed   By: Greig Pique M.D.   On: 06/27/2024 18:27    Micro Results    No results found for this or any previous visit (from the past 240 hours).  Today   Subjective    Francisco Werner today has no headache,no chest abdominal pain,no new weakness tingling or numbness, feels much better wants to go  home today.    Objective   Blood pressure (!) 135/92, pulse (!) 55, temperature 98.1 F (36.7 C), temperature source Oral, resp. rate 15, height 5' 9 (1.753 m), weight 102.3 kg, SpO2 96%.   Intake/Output Summary (Last 24 hours) at 06/30/2024 0755 Last data filed at 06/29/2024 2053 Gross per 24 hour  Intake 240 ml  Output --  Net 240 ml    Exam  Awake Alert, No new F.N deficits,    [place].AT,PERRAL, right-sided tongue bite healing, small tag of skin still appears quite vascular.  Likely will follow-up soon Supple Neck,   Symmetrical Chest wall movement, Good air movement bilaterally, CTAB RRR,No Gallops,   +ve B.Sounds, Abd Soft, Non tender,  No Cyanosis, Clubbing or edema    Data Review   Recent Labs  Lab 06/27/24 1620 06/27/24 1639 06/27/24 1955 06/28/24 0753 06/29/24 0612  WBC 5.3  --   --  11.8* 6.3  HGB 14.7 16.3 14.3 13.0 14.5  HCT 44.1 48.0 42.0 38.7* 44.2  PLT 262  --   --  224 241  MCV 85.6  --   --  84.3 86.7  MCH 28.5  --   --  28.3 28.4  MCHC 33.3  --   --  33.6 32.8  RDW 11.9  --   --  12.1 12.4  LYMPHSABS  --   --   --  1.5 1.7  MONOABS  --   --   --  1.2* 0.7  EOSABS  --   --   --  0.0 0.2  BASOSABS  --   --   --  0.0 0.1    Recent Labs  Lab 06/27/24 1620 06/27/24 1639 06/27/24 1945 06/27/24 1953 06/27/24 1955 06/28/24 0009 06/28/24 0753 06/29/24 0612  NA 128* 128* 131*  --  132* 135 135 138  K 5.3* 5.4* 4.4  --  4.4 3.9 3.8 4.1  CL 94* 98 99  --   --  102  102 102  CO2 16*  --  20*  --   --  21* 24 23  ANIONGAP 18*  --  12  --   --  12 9 13   GLUCOSE 700* 685* 371*  --   --  209* 177* 224*  BUN 25* 28* 23*  --   --  16 12 15   CREATININE 1.55* 1.30* 1.23  --   --  1.04 1.08 1.23  AST  --   --   --   --   --   --  21  --   ALT  --   --   --   --   --   --  22  --   ALKPHOS  --   --   --   --   --   --  49  --   BILITOT  --   --   --   --   --   --  1.0  --   ALBUMIN  --   --   --   --   --   --  3.4*  --   LATICACIDVEN  --  7.5*  --  2.5*  --   --  1.1  --   TSH  --   --   --   --   --   --  0.401  --   HGBA1C  --   --   --   --   --   --  12.2*  --   AMMONIA  --   --   --   --   --   --  36*  --   MG  --   --   --   --   --   --  1.9 1.7  PHOS  --   --   --   --   --   --  3.9 3.6  CALCIUM  9.6  --  9.2  --   --  9.1 8.7* 9.2    Total Time in preparing paper work, data evaluation and todays exam - 35 minutes  Signature  -    Lavada Stank M.D on 06/30/2024 at 7:55 AM   -  To page go to [email or web address]

## 2024-07-09 ENCOUNTER — Encounter: Payer: Self-pay | Admitting: Neurology

## 2024-07-09 ENCOUNTER — Ambulatory Visit: Admitting: Neurology

## 2024-07-09 VITALS — BP 166/88 | HR 66 | Ht 70.0 in | Wt 215.0 lb

## 2024-07-09 DIAGNOSIS — I679 Cerebrovascular disease, unspecified: Secondary | ICD-10-CM

## 2024-07-09 DIAGNOSIS — R569 Unspecified convulsions: Secondary | ICD-10-CM

## 2024-07-09 NOTE — Progress Notes (Signed)
 Chief Complaint  Patient presents with   New Patient (Initial Visit)   Peripheral Neuropathy    RM 15   ASSESSMENT AND PLAN  Francisco Werner is a 58 y.o. 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   MEDICAL HISTORY:  Francisco Werner is a 58 year old male, seen in request by his primary care from [facility] medical associate Dr.   Okey, Carlin Redbird, to follow-up on hospital admission in August for seizure, due to DKA, with significant elevation of glucose up to 700.  History is obtained from the patient and review of electronic medical records. I personally reviewed pertinent available imaging films in PACS.   PMHx of  DM x 10 years, insulin  dependent since 2023 HLD HTN  He had diabetes for more than 10 years, insulin -dependent over the past couple years, most recent A1c was 12.2, he was recently treated with p.o. steroid for poison ivy,  On June 27, 2024, he had a late breakfast, went for shopping, came home around 3 PM, noticed right visual field visual change, pixelated, blurry visual field on the right side, then went to tonic-clonic seizure, he came to on the ambulance en rout to emergency room, per wife, seizure lasted about 30 minutes  CT angiogram head and neck, intracranial atherosclerotic disease, no significant large vessel stenosis  MRI of the brain, mild to moderate small vessel disease no acute  abnormality  EEG June 28, 2024 was normal  Laboratory evaluations, glucose at emergency room 700, elevated creatinine 1.55, anion gap 18, normal CBC,  He was treated with DKA protocol, insulin , no antiepileptic medication was given, he was not compliant with his outpatient regimen,  He denies focal signs, now has continuous glucose monitoring, also insulin  pump   PHYSICAL EXAM:   Vitals:   07/09/24 1003 07/09/24 1007  BP: (!) 170/90 (!) 166/88  Pulse: 66   SpO2: 98%   Weight: 215 lb (97.5 kg)   Height: 5' 10 (1.778 m)    Not recorded     Body mass index is 30.85 kg/m.  PHYSICAL EXAMNIATION:  Gen: NAD, conversant, well nourised, well groomed                     Cardiovascular: Regular rate rhythm, no peripheral edema, warm, nontender. Eyes: Conjunctivae clear without exudates or hemorrhage Neck: Supple, no carotid bruits. Pulmonary: Clear to auscultation bilaterally   NEUROLOGICAL EXAM:  MENTAL STATUS: Speech/cognition: Awake, alert, oriented to history taking and casual conversation CRANIAL NERVES: CN II: Visual fields are full to confrontation. Pupils are round equal and briskly reactive to light. CN III, IV, VI: extraocular movement are normal. No ptosis. CN V: Facial sensation is intact to light touch CN VII: Face is symmetric with normal eye closure  CN VIII: Hearing is normal to causal conversation. CN IX, X: Phonation is normal. CN XI: Head turning and shoulder shrug are intact  MOTOR: There is no pronator drift of out-stretched arms. Muscle bulk  and tone are normal. Muscle strength is normal.  REFLEXES: Reflexes are 2+ and symmetric at the biceps, triceps, knees, and ankles. Plantar responses are flexor.  SENSORY: Intact to light touch, pinprick and vibratory sensation are intact in fingers and toes.  COORDINATION: There is no trunk or limb dysmetria noted.  GAIT/STANCE: Posture is normal. Gait is steady with normal steps, base, arm swing, and  turning. Heel and toe walking are normal. Tandem gait is normal.  Romberg is absent.  REVIEW OF SYSTEMS:  Full 14 system review of systems performed and notable only for as above All other review of systems were negative.   ALLERGIES: Allergies  Allergen Reactions   Naproxen Other (See Comments)    GI bleed   Shellfish-Derived Products Swelling    HOME MEDICATIONS: Current Outpatient Medications  Medication Sig Dispense Refill   acetaminophen  (TYLENOL ) 500 MG tablet Take 1 tablet (500 mg total) by mouth every 8 (eight) hours as needed for mild pain (pain score 1-3) or moderate pain (pain score 4-6). 20 tablet 0   aspirin  81 MG chewable tablet Chew 1 tablet (81 mg total) by mouth daily. 30 tablet 0   hydrOXYzine (ATARAX) 25 MG tablet Take 25 mg by mouth 3 (three) times daily. 7 day therapy for poison ivy     Insulin  Disposable Pump (OMNIPOD 5 [id number] INTRO GEN 5) KIT 1 each See admin instructions. Every 3 days     LANTUS  SOLOSTAR 100 UNIT/ML Solostar Pen Inject 25 Units into the skin daily. 15 mL 0   losartan  (COZAAR ) 50 MG tablet Take 100 mg by mouth daily.     magic mouthwash w/lidocaine  SOLN Take 5 mLs by mouth 3 (three) times daily as needed for mouth pain. Suspension contains equal amounts of Maalox Extra Strength, nystatin, diphenhydramine and lidocaine . 160 mL 0   NOVOLOG  100 UNIT/ML injection Before each meal 3 times a day, 140-199 - 2 units, 200-250 - 4 units, 251-299 - 8 units,  300-349 - 10 units,  350 or above 14 units.     OVER THE COUNTER MEDICATION Take 1 Dose by mouth daily. Black seed oil (Nigella sativa)     rosuvastatin  (CRESTOR ) 10 MG tablet Take 1 tablet (10 mg total) by mouth daily. 30 tablet 0   oxyCODONE  (OXY IR/ROXICODONE ) 5 MG immediate release tablet Take 1 tablet (5 mg total) by mouth every 8 (eight) hours as needed for severe pain (pain score 7-10) or breakthrough pain. (Patient not taking: Reported on 07/09/2024) 15 tablet 0   No current  facility-administered medications for this visit.    PAST MEDICAL HISTORY: Past Medical History:  Diagnosis Date   Diabetes mellitus without complication (HCC)    pre   Hyperlipidemia    Hypertension    borderline    PAST SURGICAL HISTORY: History reviewed. No pertinent surgical history.  FAMILY HISTORY: Family History  Problem Relation Age of Onset   Diabetes Mother    Cancer Mother        breast   Cancer Father        appendix   Diabetes Father    Hypertension Father     SOCIAL HISTORY: Social History   Socioeconomic History   Marital status: Married    Spouse name: Not on file   Number of children: Not on file   Years of education: Not on file   Highest education level: Not on file  Occupational History   Not on file  Tobacco Use   Smoking status:  Never   Smokeless tobacco: Never  Substance and Sexual Activity   Alcohol use: Yes    Comment: occasional beer   Drug use: No   Sexual activity: Not on file  Other Topics Concern   Not on file  Social History Narrative   Left handed    Lives at home with wife and kids   Social Drivers of Health   Financial Resource Strain: Not on file  Food Insecurity: No Food Insecurity (06/28/2024)   Hunger Vital Sign    Worried About Running Out of Food in the Last Year: Never true    Ran Out of Food in the Last Year: Never true  Transportation Needs: No Transportation Needs (06/28/2024)   PRAPARE - Administrator, Civil Service (Medical): No    Lack of Transportation (Non-Medical): No  Physical Activity: Not on file  Stress: Not on file  Social Connections: Not on file  Intimate Partner Violence: Not At Risk (06/28/2024)   Humiliation, Afraid, Rape, and Kick questionnaire    Fear of Current or Ex-Partner: No    Emotionally Abused: No    Physically Abused: No    Sexually Abused: No      Modena Callander, M.D. Ph.D.  [facility] [address], [address] [place], [place] [address] Ph: [phone] Fax: [phone]  CC:  Okey Carlin Redbird, MD [address] [place],  [place] [address]  Okey Carlin Redbird, MD

## 2024-09-01 ENCOUNTER — Telehealth: Payer: Self-pay

## 2024-10-20 ENCOUNTER — Other Ambulatory Visit (HOSPITAL_COMMUNITY): Payer: Self-pay
